# Patient Record
Sex: Female | Born: 1975 | Race: White | Hispanic: No | Marital: Married | Smoking: Never smoker
Health system: Southern US, Community
[De-identification: ages and names within clinical notes are randomized; demographics above are authoritative.]

## PROBLEM LIST (undated history)

## (undated) ENCOUNTER — Inpatient Hospital Stay (HOSPITAL_COMMUNITY): Payer: Self-pay

## (undated) DIAGNOSIS — O039 Complete or unspecified spontaneous abortion without complication: Secondary | ICD-10-CM

## (undated) DIAGNOSIS — J329 Chronic sinusitis, unspecified: Secondary | ICD-10-CM

## (undated) DIAGNOSIS — J309 Allergic rhinitis, unspecified: Secondary | ICD-10-CM

## (undated) DIAGNOSIS — K219 Gastro-esophageal reflux disease without esophagitis: Secondary | ICD-10-CM

## (undated) DIAGNOSIS — F411 Generalized anxiety disorder: Secondary | ICD-10-CM

## (undated) DIAGNOSIS — F419 Anxiety disorder, unspecified: Secondary | ICD-10-CM

## (undated) DIAGNOSIS — Z9889 Other specified postprocedural states: Secondary | ICD-10-CM

## (undated) DIAGNOSIS — K589 Irritable bowel syndrome without diarrhea: Secondary | ICD-10-CM

## (undated) DIAGNOSIS — N390 Urinary tract infection, site not specified: Secondary | ICD-10-CM

## (undated) DIAGNOSIS — R112 Nausea with vomiting, unspecified: Secondary | ICD-10-CM

## (undated) DIAGNOSIS — D649 Anemia, unspecified: Secondary | ICD-10-CM

## (undated) DIAGNOSIS — K5790 Diverticulosis of intestine, part unspecified, without perforation or abscess without bleeding: Secondary | ICD-10-CM

## (undated) HISTORY — DX: Anemia, unspecified: D64.9

## (undated) HISTORY — DX: Chronic sinusitis, unspecified: J32.9

## (undated) HISTORY — DX: Diverticulosis of intestine, part unspecified, without perforation or abscess without bleeding: K57.90

## (undated) HISTORY — PX: CYSTECTOMY: SUR359

## (undated) HISTORY — DX: Gastro-esophageal reflux disease without esophagitis: K21.9

## (undated) HISTORY — DX: Generalized anxiety disorder: F41.1

## (undated) HISTORY — DX: Anxiety disorder, unspecified: F41.9

## (undated) HISTORY — DX: Irritable bowel syndrome, unspecified: K58.9

## (undated) HISTORY — DX: Allergic rhinitis, unspecified: J30.9

## (undated) HISTORY — DX: Urinary tract infection, site not specified: N39.0

---

## 2004-05-25 ENCOUNTER — Other Ambulatory Visit: Admission: RE | Admit: 2004-05-25 | Discharge: 2004-05-25 | Payer: Self-pay | Admitting: Gynecology

## 2005-06-20 ENCOUNTER — Other Ambulatory Visit: Admission: RE | Admit: 2005-06-20 | Discharge: 2005-06-20 | Payer: Self-pay | Admitting: Gynecology

## 2006-06-23 ENCOUNTER — Inpatient Hospital Stay (HOSPITAL_COMMUNITY): Admission: AD | Admit: 2006-06-23 | Discharge: 2006-06-25 | Payer: Self-pay | Admitting: Obstetrics and Gynecology

## 2007-08-20 ENCOUNTER — Other Ambulatory Visit: Admission: RE | Admit: 2007-08-20 | Discharge: 2007-08-20 | Payer: Self-pay | Admitting: Gynecology

## 2008-09-25 ENCOUNTER — Other Ambulatory Visit: Admission: RE | Admit: 2008-09-25 | Discharge: 2008-09-25 | Payer: Self-pay | Admitting: Gynecology

## 2011-01-14 NOTE — Discharge Summary (Signed)
[name], Brooke [name] NO.:  [id number]   MEDICAL RECORD NO.:  [id number]          PATIENT TYPE:  INP   LOCATION:  9111                          FACILITY:  [facility]   PHYSICIAN:  Zenaida Niece, M.D.DATE OF BIRTH:  1976-07-06   DATE OF ADMISSION:  06/23/2006  DATE OF DISCHARGE:  06/25/2006                                 DISCHARGE SUMMARY   ADMISSION DIAGNOSES:  1. Intrauterine pregnancy at 39 weeks.  2. History of infertility.  3. Group B streptococcus carrier.   DISCHARGE DIAGNOSES:  1. Intrauterine pregnancy at 39 weeks.  2. History of infertility.  3. Group B streptococcus carrier.   PROCEDURES:  On June 23, 2006, she had a spontaneous vaginal delivery.   HISTORY AND PHYSICAL:  This is a 35 year old white female gravida 1, para 0  with an EGA of [redacted] weeks who presents with the complaint of spontaneous  rupture of membranes at 0100 on the day of admission.  She also had regular  contractions.  Prenatal care complicated by intrauterine insemination and a  history of infertility.   PRENATAL LABS:  Blood type is O positive with a negative antibody screen,  RPR nonreactive, rubella immune, Hepatitis B Surface Antigen negative, HIV  negative, gonorrhea and Chlamydia negative.  Quad screen is normal, 1-hour  Glucola 99 and group B strep is positive.   PAST MEDICAL HISTORY:  Gastroesophageal reflux.   PAST SURGICAL HISTORY:  Ganglion cyst.   PHYSICAL EXAM:  She is afebrile with stable vital signs.  Fetal heart  tracing is reassuring.  ABDOMEN:  Gravid and nontender.  CERVIX:  On admission she was about 5 cm dilated.   HOSPITAL COURSE:  The patient was admitted and continued to contract on her  own.  She received penicillin for group B strep prophylaxis.  She progressed  and received an epidural.  She progressed to complete, pushed well with some  nausea and vomiting while pushing.  On the afternoon of October 26th she had  a vaginal delivery of a viable  female infant with Apgars of 8 and 9, that  weighed 6 pounds 9 ounces, over a second degree midline episiotomy due to  maternal exhaustion.  Placenta delivered spontaneous was intact and was sent  for cord blood collection.  Her second degree episiotomy was repaired with  #3-0 Vicryl and estimated blood loss was less than 500 mL.  Postpartum she  had no significant complications and on postpartum day #2 was felt to be  stable enough for discharge home.   DISCHARGE INSTRUCTIONS:  Regular diet, pelvic rest, follow up in 6 weeks.   MEDICATIONS:  Over-the-counter ibuprofen as needed and she is given her  discharge pamphlet.      Zenaida Niece, M.D.  Electronically Signed     TDM/MEDQ  D:  06/25/2006  T:  06/26/2006  Job:  [id number]

## 2012-05-31 ENCOUNTER — Institutional Professional Consult (permissible substitution): Payer: Self-pay | Admitting: Internal Medicine

## 2012-06-06 ENCOUNTER — Encounter: Payer: Self-pay | Admitting: Pulmonary Disease

## 2012-06-07 ENCOUNTER — Other Ambulatory Visit (INDEPENDENT_AMBULATORY_CARE_PROVIDER_SITE_OTHER): Payer: BC Managed Care – PPO

## 2012-06-07 ENCOUNTER — Encounter: Payer: Self-pay | Admitting: Pulmonary Disease

## 2012-06-07 ENCOUNTER — Ambulatory Visit (INDEPENDENT_AMBULATORY_CARE_PROVIDER_SITE_OTHER): Payer: BC Managed Care – PPO | Admitting: Pulmonary Disease

## 2012-06-07 VITALS — BP 112/74 | HR 78 | Temp 99.2°F | Ht 63.0 in | Wt 139.8 lb

## 2012-06-07 DIAGNOSIS — R0609 Other forms of dyspnea: Secondary | ICD-10-CM

## 2012-06-07 DIAGNOSIS — R942 Abnormal results of pulmonary function studies: Secondary | ICD-10-CM

## 2012-06-07 DIAGNOSIS — J45909 Unspecified asthma, uncomplicated: Secondary | ICD-10-CM

## 2012-06-07 DIAGNOSIS — I2789 Other specified pulmonary heart diseases: Secondary | ICD-10-CM

## 2012-06-07 DIAGNOSIS — R0600 Dyspnea, unspecified: Secondary | ICD-10-CM

## 2012-06-07 DIAGNOSIS — I272 Pulmonary hypertension, unspecified: Secondary | ICD-10-CM

## 2012-06-07 DIAGNOSIS — R0989 Other specified symptoms and signs involving the circulatory and respiratory systems: Secondary | ICD-10-CM

## 2012-06-07 LAB — TSH: TSH: 1.21 u[IU]/mL (ref 0.35–5.50)

## 2012-06-07 MED ORDER — ALBUTEROL SULFATE HFA 108 (90 BASE) MCG/ACT IN AERS: 2.0000 | INHALATION_SPRAY | Freq: Four times a day (QID) | RESPIRATORY_TRACT | Status: DC | PRN

## 2012-06-07 MED ORDER — SPACER/AERO CHAMBER MOUTHPIECE MISC: 1.0000 | Status: DC

## 2012-06-07 NOTE — Progress Notes (Signed)
Subjective:    Patient ID: Brooke [name], female    DOB: [birth year], 36 y.o.   MRN: [id number]  Brooke [name] is a 36 yo woman, present for a new consult requested by Dr. Sudie Bailey for dyspnea and evaluation for possible pulmonary hypertension.    HPI 36 yo woman with PMH significant for shortness of breath and chest tightness approximately since January 2013. Extensive evaluation has not identified an etiology. Dyspnea occurs at rest but predominantly with exertion, walking on flat surfaces; used to walk several miles daily; now report an episode of significant dyspnea when attempted to walk at a slow pace, 2 1/2 miles.  She denies cough, fever, chills, weight loss, LE edema, paroxysmal nocturnal dyspnea, wheezing, . 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 She admits nasal congestion and drainage, has been given nasonex but she has not used it consistently. Has been given an inhaler several years back but she has not used one in years.   Past Medical History  Diagnosis Date  . Generalized anxiety disorder   . Esophageal reflux   . Unspecified sinusitis (chronic)    Past Surgical History  Procedure Date  . Cystectomy     left wrist   Family History  Problem Relation Age of Onset  . Diabetes Father   . Hypertension Father   . Hypertension Mother   . Diverticulosis Mother   . Prostate cancer Paternal Uncle   . Leukemia Paternal Uncle   . Hypertension Other     sibling   History   Social History  . Marital Status: Married    Spouse Name: N/A    Number of Children: 1  . Years of Education: N/A   Occupational History  . 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 Smoking status: Never Smoker   . Smokeless  tobacco: Never Used  . Alcohol Use: No  . Drug Use: No  . Sexually Active: Not on file   Other Topics Concern  . Not on file   Social History Narrative  . No narrative on file   Allergies  Allergen Reactions  . Prednisone     Racing heart, unable to sleep, "feels crazy"    Review of Systems  Constitutional: Negative for fever, chills, diaphoresis, activity change, appetite change, fatigue and unexpected weight change.  HENT: Positive for congestion and postnasal drip. Negative for hearing loss, ear pain, nosebleeds, sore throat, facial swelling, rhinorrhea, sneezing, mouth sores, trouble swallowing, neck pain, neck stiffness, dental problem, voice change, sinus pressure, tinnitus and ear discharge.   Eyes: Negative for visual disturbance.  Respiratory: Positive for chest tightness and shortness of breath. Negative for cough, choking and wheezing.   Cardiovascular: Positive for chest pain. Negative for palpitations.  Gastrointestinal: Negative for nausea, vomiting, abdominal pain, constipation and abdominal distention.  Genitourinary: Negative for difficulty urinating.  Musculoskeletal: Negative for myalgias, back pain, joint swelling, arthralgias and gait problem.  Skin: Negative for rash.  Neurological: Negative for dizziness, tremors, speech difficulty, weakness, light-headedness and headaches.  Hematological: Does not bruise/bleed easily.  Psychiatric/Behavioral: Negative for confusion, disturbed wake/sleep cycle and agitation. The patient is nervous/anxious.     Current Outpatient Prescriptions  Medication Sig Dispense Refill  .  albuterol (PROVENTIL HFA;VENTOLIN HFA) 108 (90 BASE) MCG/ACT inhaler Inhale 2 puffs into the lungs every 6 (six) hours as needed for wheezing.  1 Inhaler  6  . dexlansoprazole (DEXILANT) 60 MG capsule Take 60 mg by mouth daily.      Marland Kitchen Spacer/Aero Chamber Mouthpiece MISC 1 applicator by Does not apply route 1 day or 1 dose.  1 each  0  . UNABLE TO FIND  Med Name: Progesterone 3% Cream --daily       Results for orders placed in visit on 06/07/12  TSH      Component Value Range   TSH 1.21  0.35 - 5.50 uIU/mL  IGE      Component Value Range   IgE (Immunoglobulin E), Serum 24.8  0.0 - 180.0 IU/mL  EOSINOPHIL COUNT      Component Value Range   Eosinophils Absolute 0.0  0.0 - 0.7 K/uL  ANA      Component Value Range   ANA NEG  NEGATIVE  RHEUMATOID FACTOR      Component Value Range   Rheumatoid Factor 17 (*) <=14 IU/mL         Objective:   Physical Exam  Physical exam.  BP 112/74  Pulse 78  Temp 99.2 F (37.3 C) (Oral)  Ht 5\' 3"  (1.6 m)  Wt 139 lb 12.8 oz (63.413 kg)  BMI 24.76 kg/m2  SpO2 98%  General: Comfortable,  HEENT ; pupils round and reactive to light; severe nasal mucosa edema and erythema; oropharynx  with no erythema, edema or exudate; no cervical LAD  Cardiovascular: s1s2, no murmurs. Regular rate and rhythm. No JVD Respiratory: Decreased breath sounds bilaterally with no wheezing, rhonchi or crackles No increased work of breathing.  Abdomen: soft NT ND BS+.   Extremities: no pedal edema. Homans negative  Central nervous system: Alert and oriented No focal neurological deficits. Skin no rash Musculoskeletal no joint changes, normal bulk and tone  CTA 09/16/11  Lungs clear and no PE (we will have to obtian images)  PFTs 05/24/12 FVC 3.21 89% (pre) 3.21  89% FEV1 2.38 80% (pre) 2.46L 83% FEV1/FVC 74 (pre) 77 FEF 25/75 56% (pre) and 66% (post) 18% improvement   TLC 71% RV 33%  FRC 49%  RV/TLC ration 47  DLCO 121%, DLVA 145%   2 D echo 05/24/2012  EF 60-65%, normal diastolic filling  TR trace regurgitation; unable to estimate RVSP due to inadequate TR jet spectral doppler; IVC dialted; with no significant respiratory collapse, which is consistent with R atrial pressure >20 mm HG         Assessment & Plan:    1. Dyspnea suspect a component of asthma; also PFTs suggestive of a restrictive  disorder, possible respiratory muscle weakness. We will obtain MIP and MEP with next visit. In the interim I advised the patient to initiate inhaled albuterol and use it prior to activity. We will see her back in 2-4 weeks to review the tests. Doubt pulmonary hypertension; the patient has no clinical sign, no jvd, no edema, no murmurs. We will repeat a 2 D echo given that the previous one was associated with technical difficulties and the TR doppler jet was incomplete. If everything turns out negative, I would like her to have a methacholine challenge.   2. Allergic rhinitis use Nasonex daily   3. GERD Continue Dexilant and start Zantac twice daily/   4. General health; she received a flu vaccine  5. F/U 2-4 weeks to review the  tests.   Vanetta Mulders, MD  [facility]  [place], [place]

## 2012-06-07 NOTE — Patient Instructions (Addendum)
We will repeat a TSH and we will obtain some additional blood tests to test you for allergies.   For pulmonary hypertension we will repeat a 2 D echocardiogram to re evaluate pulmonary artery pressure.   Given slight abnormalities on the breathing tests, we will obtain additional tests to assess for respiratory muscle strength.   I think you have a component of asthma; please start taking an inhaler as needed. It is important to have your nasal congestion under control, please use Nasonex daily.   For GERD please start Zantac twice daily and continue Dexilant.   We will see you back in 2-4 weeks to review the tests.

## 2012-06-08 ENCOUNTER — Encounter: Payer: Self-pay | Admitting: Pulmonary Disease

## 2012-06-08 DIAGNOSIS — F411 Generalized anxiety disorder: Secondary | ICD-10-CM | POA: Insufficient documentation

## 2012-06-08 DIAGNOSIS — R942 Abnormal results of pulmonary function studies: Secondary | ICD-10-CM | POA: Insufficient documentation

## 2012-06-08 DIAGNOSIS — J309 Allergic rhinitis, unspecified: Secondary | ICD-10-CM | POA: Insufficient documentation

## 2012-06-08 DIAGNOSIS — R0600 Dyspnea, unspecified: Secondary | ICD-10-CM | POA: Insufficient documentation

## 2012-06-08 DIAGNOSIS — K219 Gastro-esophageal reflux disease without esophagitis: Secondary | ICD-10-CM | POA: Insufficient documentation

## 2012-06-29 ENCOUNTER — Other Ambulatory Visit (HOSPITAL_COMMUNITY): Payer: BC Managed Care – PPO

## 2012-07-03 ENCOUNTER — Ambulatory Visit (HOSPITAL_COMMUNITY): Payer: BC Managed Care – PPO | Attending: Cardiology | Admitting: Radiology

## 2012-07-03 DIAGNOSIS — J45909 Unspecified asthma, uncomplicated: Secondary | ICD-10-CM | POA: Insufficient documentation

## 2012-07-03 DIAGNOSIS — I272 Pulmonary hypertension, unspecified: Secondary | ICD-10-CM

## 2012-07-03 DIAGNOSIS — I369 Nonrheumatic tricuspid valve disorder, unspecified: Secondary | ICD-10-CM | POA: Insufficient documentation

## 2012-07-03 DIAGNOSIS — R0989 Other specified symptoms and signs involving the circulatory and respiratory systems: Secondary | ICD-10-CM | POA: Insufficient documentation

## 2012-07-03 DIAGNOSIS — R0609 Other forms of dyspnea: Secondary | ICD-10-CM | POA: Insufficient documentation

## 2012-07-03 NOTE — Progress Notes (Signed)
Echocardiogram performed.  

## 2012-07-05 ENCOUNTER — Ambulatory Visit: Payer: BC Managed Care – PPO | Admitting: Pulmonary Disease

## 2012-07-11 ENCOUNTER — Ambulatory Visit (INDEPENDENT_AMBULATORY_CARE_PROVIDER_SITE_OTHER): Payer: BC Managed Care – PPO | Admitting: Pulmonary Disease

## 2012-07-11 DIAGNOSIS — I272 Pulmonary hypertension, unspecified: Secondary | ICD-10-CM

## 2012-07-11 DIAGNOSIS — I2789 Other specified pulmonary heart diseases: Secondary | ICD-10-CM

## 2012-07-11 NOTE — Progress Notes (Signed)
PFT done today. 

## 2012-07-20 ENCOUNTER — Ambulatory Visit (INDEPENDENT_AMBULATORY_CARE_PROVIDER_SITE_OTHER): Payer: BC Managed Care – PPO | Admitting: Pulmonary Disease

## 2012-07-20 ENCOUNTER — Encounter: Payer: Self-pay | Admitting: Pulmonary Disease

## 2012-07-20 VITALS — BP 100/60 | HR 71 | Temp 98.1°F | Ht 63.5 in | Wt 139.0 lb

## 2012-07-20 DIAGNOSIS — R0600 Dyspnea, unspecified: Secondary | ICD-10-CM

## 2012-07-20 DIAGNOSIS — K219 Gastro-esophageal reflux disease without esophagitis: Secondary | ICD-10-CM

## 2012-07-20 DIAGNOSIS — R0609 Other forms of dyspnea: Secondary | ICD-10-CM

## 2012-07-20 DIAGNOSIS — R942 Abnormal results of pulmonary function studies: Secondary | ICD-10-CM

## 2012-07-20 DIAGNOSIS — J309 Allergic rhinitis, unspecified: Secondary | ICD-10-CM

## 2012-07-20 MED ORDER — FLUTICASONE PROPIONATE HFA 110 MCG/ACT IN AERO: 1.0000 | INHALATION_SPRAY | Freq: Two times a day (BID) | RESPIRATORY_TRACT | Status: DC

## 2012-07-20 NOTE — Patient Instructions (Addendum)
Please initiate Flovent, one spray daily.   We will schedule a breathing test, called Methacholine challenge test and a liver ultrasound.   We will see you back in 2 months.

## 2012-07-22 NOTE — Progress Notes (Signed)
Subjective:    Patient ID: Brooke [name], female    DOB: [birth year], 36 y.o.   MRN: [id number]  Brooke [name] is a 36 yo woman, presents for regular follow up  for dyspnea and evaluation for possible pulmonary hypertension.    HPI 36 yo woman with PMH significant for shortness of breath and chest tightness approximately since January 2013. Extensive evaluation has not identified an etiology. Dyspnea occurs at rest but predominantly with exertion, walking on flat surfaces; used to walk several miles daily; now report an episode of significant dyspnea when attempted to walk at a slow pace, 2 1/2 miles.  She denies cough, fever, chills, weight loss, LE edema, paroxysmal nocturnal dyspnea, wheezing, . 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 She admits nasal congestion and drainage, has been given nasonex but she has not used it consistently. Has been given an inhaler several years back but she has not used one in years.   Last visit. Albuterol was started for prn use; she has not been using it consistently prior to exertion; she thinks it helps with the symptoms.   Today: reports no acute problems or changes in her health status. Denies fever, chills, cough; dyspnea as per HPI; no hemoptysis; chest tightness as above. No LE edema or paroxysmal nocturnal dyspnea. Cont to have GERD symptoms, but improved.    Past Medical History  Diagnosis Date  . Generalized anxiety disorder   . Esophageal reflux   . Unspecified sinusitis (chronic)   . Allergic rhinitis    Past Surgical History  Procedure Date  . Cystectomy     left wrist   Family History  Problem Relation Age of Onset  . Diabetes Father   . Hypertension Father   . Hypertension Mother   . Diverticulosis  Mother   . Prostate cancer Paternal Uncle   . Leukemia Paternal Uncle   . Hypertension Other     sibling   History   Social History  . Marital Status: Married    Spouse Name: N/A    Number of Children: 1  . Years of Education: N/A   Occupational History  . 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 Smoking status: Never Smoker   . Smokeless tobacco: Never Used  . Alcohol Use: No  . Drug Use: No  . Sexually Active: Not on file   Other Topics Concern  . Not on file   Social History Narrative  . No narrative on file   Allergies  Allergen Reactions  . Prednisone     Racing heart, unable to sleep, "feels crazy"    Review of Systems  Constitutional: Negative for fever, chills, diaphoresis, activity change, appetite change, fatigue and unexpected weight change.  HENT: Positive for congestion and postnasal drip. Negative for hearing loss, ear pain, nosebleeds, sore throat, facial swelling, rhinorrhea, sneezing, mouth sores, trouble swallowing, neck pain, neck stiffness, dental problem, voice change, sinus pressure, tinnitus and ear discharge.   Eyes: Negative for visual disturbance.  Respiratory: Positive for chest tightness and shortness of breath. Negative for cough, choking and wheezing.   Cardiovascular: Negative for chest pain. Negative for palpitations.  Gastrointestinal: Negative for nausea, vomiting, abdominal pain, constipation and abdominal  distention.  Genitourinary: Negative for difficulty urinating.  Musculoskeletal: Negative for myalgias, back pain, joint swelling, arthralgias and gait problem.  Skin: Negative for rash.  Neurological: Negative for dizziness, tremors, speech difficulty, weakness, light-headedness and headaches.  Hematological: Does not bruise/bleed easily.  Psychiatric/Behavioral: Negative for confusion, disturbed wake/sleep cycle and agitation.     Current Outpatient Prescriptions  Medication Sig Dispense  Refill  . albuterol (PROVENTIL HFA;VENTOLIN HFA) 108 (90 BASE) MCG/ACT inhaler Inhale 2 puffs into the lungs every 6 (six) hours as needed for wheezing.  1 Inhaler  6  . dexlansoprazole (DEXILANT) 60 MG capsule Take 60 mg by mouth daily.      Marland Kitchen NASONEX 50 MCG/ACT nasal spray 2 sprays each nostril once a day      . Spacer/Aero Chamber Mouthpiece MISC 1 applicator by Does not apply route 1 day or 1 dose.  1 each  0  . UNABLE TO FIND Med Name: Progesterone 3% Cream --daily      . fluticasone (FLOVENT HFA) 110 MCG/ACT inhaler Inhale 1 puff into the lungs 2 (two) times daily.  1 Inhaler  12   Results for orders placed in visit on 06/07/12  TSH      Component Value Range   TSH 1.21  0.35 - 5.50 uIU/mL  IGE      Component Value Range   IgE (Immunoglobulin E), Serum 24.8  0.0 - 180.0 IU/mL  EOSINOPHIL COUNT      Component Value Range   Eosinophils Absolute 0.0  0.0 - 0.7 K/uL  ANA      Component Value Range   ANA NEG  NEGATIVE  RHEUMATOID FACTOR      Component Value Range   Rheumatoid Factor 17 (*) <=14 IU/mL         Objective:   Physical Exam  Physical exam.  BP 100/60  Pulse 71  Temp 98.1 F (36.7 C) (Oral)  Ht 5' 3.5" (1.613 m)  Wt 63.05 kg (139 lb)  BMI 24.24 kg/m2  SpO2 99%   General: Comfortable,  HEENT ; pupils round and reactive to light; severe nasal mucosa edema and erythema; oropharynx  with no erythema, edema or exudate; no cervical LAD  Cardiovascular: s1s2, no murmurs. Regular rate and rhythm. No JVD but hepato jugular reflux;  Respiratory: Decreased breath sounds bilaterally with no wheezing, rhonchi or crackles No increased work of breathing.  Abdomen: soft NT ND BS+.  No hepatomegaly or plenomegaly Extremities: no pedal edema. Homans negative  Central nervous system: Alert and oriented No focal neurological deficits. Skin no rash Musculoskeletal no joint changes, normal bulk and tone  CTA 09/16/11  Lungs clear and no PE (we will have to obtian  images)  PFTs 05/24/12 FVC 3.21 89% (pre) 3.21  89% FEV1 2.38 80% (pre) 2.46L 83% FEV1/FVC 74 (pre) 77 FEF 25/75 56% (pre) and 66% (post) 18% improvement   TLC 71% RV 33%  FRC 49%  RV/TLC ration 47  DLCO 121%, DLVA 145%   2 D echo 05/24/2012  EF 60-65%, normal diastolic filling  TR trace regurgitation; unable to estimate RVSP due to inadequate TR jet spectral doppler; IVC dialted; with no significant respiratory collapse, which is consistent with R atrial pressure >20 mm HG   2 D echo 07/03/2012  Left ventricle: The cavity size was normal. Wall thickness was normal. The estimated ejection fraction was 60%. Wall motion was normal; there were no regional wall motion abnormalities. Left ventricular diastolic function parameters were normal. - Impressions: IVC  is dilated with normal collapse. This suggests CVP of 6-10. Impressions: - IVC is dilated with normal collapse. This suggests CVP of 6-10.        Assessment & Plan:    1. Dyspnea suspect a component of asthma; Her symptoms are suggestive of asthma; also PFTs suggestive of a restrictive disorder, possible respiratory muscle weakness. We will obtain MIP and MEP with next visit. In the interim I advised the patient to continue inhaled albuterol and use it prior to activity. We will see her back in January to review the tests.  I would like her to have a methacholine challenge. In the interim I advised her to initiate inhaled steroids.   Repeat a 2 D echo with no evidence of TR or pulmonary hypertension, but persistent IVC dilation.   On exam she does have hepatojugular reflux.For IVC dialation I will obtain a US of the RUQ with doppler to evaluate for suprahepatic/hepatic vein thrombosis; If this test is negative I will refer her to cardiology for cardiac MRI and possible R heart catheterization.   2. Allergic rhinitis use Nasonex daily   3. GERD Continue Dexilant and start Zantac twice daily/   4. General health; she  received a flu vaccine  5. F/U 2  months to review the tests.   Vanetta Mulders, MD  [facility]  [place], [place]

## 2012-07-23 NOTE — Addendum Note (Signed)
Addended by: Tommie Sams on: 07/23/2012 08:46 AM   Modules accepted: Orders

## 2012-07-31 ENCOUNTER — Other Ambulatory Visit: Payer: Self-pay | Admitting: Pulmonary Disease

## 2012-07-31 ENCOUNTER — Ambulatory Visit (HOSPITAL_COMMUNITY)
Admission: RE | Admit: 2012-07-31 | Discharge: 2012-07-31 | Disposition: A | Discharge status: Home / Self Care | Payer: BC Managed Care – PPO | Source: Ambulatory Visit | Attending: Pulmonary Disease | Admitting: Pulmonary Disease

## 2012-07-31 DIAGNOSIS — R0600 Dyspnea, unspecified: Secondary | ICD-10-CM

## 2012-07-31 DIAGNOSIS — R0609 Other forms of dyspnea: Secondary | ICD-10-CM | POA: Insufficient documentation

## 2012-07-31 DIAGNOSIS — R0989 Other specified symptoms and signs involving the circulatory and respiratory systems: Secondary | ICD-10-CM | POA: Insufficient documentation

## 2012-08-01 ENCOUNTER — Telehealth: Payer: Self-pay | Admitting: Pulmonary Disease

## 2012-08-01 ENCOUNTER — Encounter (HOSPITAL_COMMUNITY): Payer: BC Managed Care – PPO

## 2012-08-01 DIAGNOSIS — I871 Compression of vein: Secondary | ICD-10-CM

## 2012-08-01 NOTE — Telephone Encounter (Signed)
I talked to the patient and reviewed the test results.  Mindy,  Could you please order an MRI of the heart for Ms. Vuncannon;  Thank you,

## 2012-08-01 NOTE — Telephone Encounter (Signed)
I spoke with pt and she stated she is returning Dr. Liliane Channel call regarding her test results from 07/31/12. Pt aware she is not back in the office until 08/02/12. Please advise Dr. Frederico Hamman thanks

## 2012-08-02 NOTE — Telephone Encounter (Signed)
I have spoken with Dr. Frederico Hamman and have placed ordered as directed with her. Orders sent to [facility]. thanks

## 2012-08-02 NOTE — Telephone Encounter (Signed)
mindy will you close this out i an't close these messages amymore Tobe Sos

## 2012-08-16 ENCOUNTER — Encounter (HOSPITAL_COMMUNITY): Payer: BC Managed Care – PPO

## 2012-08-23 ENCOUNTER — Inpatient Hospital Stay (HOSPITAL_COMMUNITY): Admission: RE | Admit: 2012-08-23 | Payer: BC Managed Care – PPO | Source: Ambulatory Visit

## 2012-08-27 ENCOUNTER — Encounter (HOSPITAL_COMMUNITY): Payer: BC Managed Care – PPO

## 2012-09-06 ENCOUNTER — Telehealth: Payer: Self-pay | Admitting: Pulmonary Disease

## 2012-09-06 ENCOUNTER — Encounter (HOSPITAL_COMMUNITY): Payer: BC Managed Care – PPO

## 2012-09-06 NOTE — Telephone Encounter (Signed)
Called and spoke with patient and she is aware that we are working on getting this scheduled and will get back with her just as soon as appointment has been scheduled. Brooke [name]

## 2012-09-06 NOTE — Telephone Encounter (Signed)
This is the order that Cardiology stated that was put in incorrectly and Almyra Free advised what cardiology had stated that order needed to be re-entered correctly. The order was placed correctly and order was cancelled by someone. We have to go through Cardiology to schedule b/c one of their physicians are there to read. Waiting on Cardiology to return my call to schedule. Rhonda J Cobb

## 2012-09-06 NOTE — Telephone Encounter (Signed)
Order was placed 08/02/12. Does not look this  Was scheduled for pt. Please advise [facility] thanks

## 2012-09-10 NOTE — Telephone Encounter (Signed)
[facility], pls advise if this has been scheduled yet.  Thank you.

## 2012-09-10 NOTE — Telephone Encounter (Signed)
Apparently there is only one scheduler at [facility] that schedules this test. I have called trying to get this scheduled, however, scheduler is out sick today and Cardiology does not know if she will be back tomorrow. Pt is going out of town on 09/12/12 and will not return until 09/18/12. Pt has appointment with Dr. Frederico Hamman on 09/19/12. Does this appointment need to be r/s until I can get the MRI Cardiac scheduled? Please advise. Rhonda J Cobb

## 2012-09-10 NOTE — Telephone Encounter (Signed)
Have sent 2 staff messages to Cardiology to arrange. Tests have been approved from insurance and pending scheduling. Sent urgent staff message to scheduler to arrange asap. Pt has an follow up ov to see Dr. Frederico Hamman on 09/19/12. Rhonda J Cobb

## 2012-09-11 ENCOUNTER — Other Ambulatory Visit: Payer: Self-pay | Admitting: Pulmonary Disease

## 2012-09-11 DIAGNOSIS — R0602 Shortness of breath: Secondary | ICD-10-CM

## 2012-09-11 NOTE — Telephone Encounter (Signed)
LMTCB x 1 for the pt. It looks like she is scheduled for MRI on 09/17/12 and just want to make sure this is correct andif so we can keep the appt with Dr. Frederico Hamman as scheduled.

## 2012-09-11 NOTE — Telephone Encounter (Signed)
Yes please, ask to reschedule after test done.

## 2012-09-11 NOTE — Telephone Encounter (Signed)
Pt returned triage's call.  Pt states that she will not be completing MRI on 09/17/12 as she will be out of town.  Antionette Fairy

## 2012-09-11 NOTE — Telephone Encounter (Signed)
i spoke with pt. She stated she is going to call us back when she r/s her MRI. She stated cardiology will be giving her a call once they r/s this with her. Nothing further was needed

## 2012-09-11 NOTE — Telephone Encounter (Signed)
Per Bjorn Loser, she has not been able to get this test scheduled and the pt has an appt with Dr. Frederico Hamman on 09/19/12. Dr. Frederico Hamman, do you want to reschedule your appt with the pt and see her after the test has been done? Pls advise.

## 2012-09-12 ENCOUNTER — Encounter: Payer: Self-pay | Admitting: Pulmonary Disease

## 2012-09-17 ENCOUNTER — Ambulatory Visit (HOSPITAL_COMMUNITY): Payer: BC Managed Care – PPO

## 2012-09-18 ENCOUNTER — Telehealth: Payer: Self-pay | Admitting: Pulmonary Disease

## 2012-09-18 ENCOUNTER — Ambulatory Visit (HOSPITAL_COMMUNITY)
Admission: RE | Admit: 2012-09-18 | Discharge: 2012-09-18 | Disposition: A | Discharge status: Home / Self Care | Payer: BC Managed Care – PPO | Source: Ambulatory Visit | Attending: Pulmonary Disease | Admitting: Pulmonary Disease

## 2012-09-18 DIAGNOSIS — R0989 Other specified symptoms and signs involving the circulatory and respiratory systems: Secondary | ICD-10-CM | POA: Insufficient documentation

## 2012-09-18 DIAGNOSIS — R0600 Dyspnea, unspecified: Secondary | ICD-10-CM

## 2012-09-18 DIAGNOSIS — R0609 Other forms of dyspnea: Secondary | ICD-10-CM | POA: Insufficient documentation

## 2012-09-18 DIAGNOSIS — R942 Abnormal results of pulmonary function studies: Secondary | ICD-10-CM | POA: Insufficient documentation

## 2012-09-18 LAB — PULMONARY FUNCTION TEST

## 2012-09-18 MED ORDER — METHACHOLINE 16 MG/ML NEB SOLN
2.0000 mL | Freq: Once | RESPIRATORY_TRACT | Status: AC
  Administered 2012-09-18: 32 mg via RESPIRATORY_TRACT

## 2012-09-18 MED ORDER — METHACHOLINE 0.25 MG/ML NEB SOLN
2.0000 mL | Freq: Once | RESPIRATORY_TRACT | Status: AC
  Administered 2012-09-18: 0.5 mg via RESPIRATORY_TRACT

## 2012-09-18 MED ORDER — SODIUM CHLORIDE 0.9 % IN NEBU
3.0000 mL | INHALATION_SOLUTION | Freq: Once | RESPIRATORY_TRACT | Status: AC
  Administered 2012-09-18: 3 mL via RESPIRATORY_TRACT

## 2012-09-18 MED ORDER — METHACHOLINE 4 MG/ML NEB SOLN
2.0000 mL | Freq: Once | RESPIRATORY_TRACT | Status: AC
  Administered 2012-09-18: 8 mg via RESPIRATORY_TRACT

## 2012-09-18 MED ORDER — METHACHOLINE 0.0625 MG/ML NEB SOLN
2.0000 mL | Freq: Once | RESPIRATORY_TRACT | Status: AC
  Administered 2012-09-18: 0.125 mg via RESPIRATORY_TRACT

## 2012-09-18 MED ORDER — ALBUTEROL SULFATE (5 MG/ML) 0.5% IN NEBU
2.5000 mg | INHALATION_SOLUTION | Freq: Once | RESPIRATORY_TRACT | Status: AC
  Administered 2012-09-18: 2.5 mg via RESPIRATORY_TRACT

## 2012-09-18 MED ORDER — METHACHOLINE 1 MG/ML NEB SOLN
2.0000 mL | Freq: Once | RESPIRATORY_TRACT | Status: AC
  Administered 2012-09-18: 2 mg via RESPIRATORY_TRACT

## 2012-09-18 NOTE — Telephone Encounter (Signed)
Pt just needs to schedule OV after she completes her tests w/ Dr. Frederico Hamman.  lmomtcb x1

## 2012-09-19 ENCOUNTER — Ambulatory Visit: Payer: BC Managed Care – PPO | Admitting: Pulmonary Disease

## 2012-09-19 NOTE — Telephone Encounter (Signed)
LMTCB x 1 

## 2012-09-19 NOTE — Telephone Encounter (Signed)
Returning call can be reached at [phone].Raylene Everts

## 2012-09-19 NOTE — Telephone Encounter (Signed)
Pt just needs to schedule OV with Dr. Frederico Hamman lmtcb x1

## 2012-09-20 NOTE — Telephone Encounter (Signed)
LMTCB

## 2012-09-21 ENCOUNTER — Telehealth: Payer: Self-pay | Admitting: Pulmonary Disease

## 2012-09-21 DIAGNOSIS — R0600 Dyspnea, unspecified: Secondary | ICD-10-CM

## 2012-09-21 NOTE — Telephone Encounter (Signed)
There is another message open on this pt. I will close this one and continue in new message. Carron Curie, CMA

## 2012-09-21 NOTE — Telephone Encounter (Signed)
Spoke with Rodney Booze at [facility] patient scheduled for MRI per Dr. Frederico Hamman on Monday Jan 27. Tasha indicates Creatinine only is needed for this test. Order placed.   According to previous phone msg from 09/06/12, pt still needs OV scheduled w Dr. Frederico Hamman.  ATC pt no answer. LMOMTCB

## 2012-09-21 NOTE — Telephone Encounter (Signed)
LMTCBx1 pt needs to schedule an appt with Dr. Frederico Hamman. Carron Curie, CMA

## 2012-09-24 ENCOUNTER — Ambulatory Visit (HOSPITAL_COMMUNITY): Admission: RE | Admit: 2012-09-24 | Payer: BC Managed Care – PPO | Source: Ambulatory Visit

## 2012-09-24 ENCOUNTER — Ambulatory Visit (HOSPITAL_COMMUNITY)
Admission: RE | Admit: 2012-09-24 | Discharge: 2012-09-24 | Disposition: A | Discharge status: Home / Self Care | Payer: BC Managed Care – PPO | Source: Ambulatory Visit | Attending: Pulmonary Disease | Admitting: Pulmonary Disease

## 2012-09-24 DIAGNOSIS — R0989 Other specified symptoms and signs involving the circulatory and respiratory systems: Secondary | ICD-10-CM | POA: Insufficient documentation

## 2012-09-24 DIAGNOSIS — R0602 Shortness of breath: Secondary | ICD-10-CM

## 2012-09-24 DIAGNOSIS — I871 Compression of vein: Secondary | ICD-10-CM

## 2012-09-24 DIAGNOSIS — R0609 Other forms of dyspnea: Secondary | ICD-10-CM | POA: Insufficient documentation

## 2012-09-24 DIAGNOSIS — K7689 Other specified diseases of liver: Secondary | ICD-10-CM | POA: Insufficient documentation

## 2012-09-24 MED ORDER — GADOBENATE DIMEGLUMINE 529 MG/ML IV SOLN
20.0000 mL | Freq: Once | INTRAVENOUS | Status: AC
  Administered 2012-09-24: 20 mL via INTRAVENOUS

## 2012-09-25 NOTE — Telephone Encounter (Signed)
Pt is scheduled to see Dr. Frederico Hamman on 09/26/12 for follow-up on MRI.

## 2012-09-26 ENCOUNTER — Telehealth: Payer: Self-pay | Admitting: Pulmonary Disease

## 2012-09-26 ENCOUNTER — Ambulatory Visit: Payer: BC Managed Care – PPO | Admitting: Pulmonary Disease

## 2012-09-26 ENCOUNTER — Ambulatory Visit (INDEPENDENT_AMBULATORY_CARE_PROVIDER_SITE_OTHER): Payer: BC Managed Care – PPO | Admitting: Pulmonary Disease

## 2012-09-26 ENCOUNTER — Encounter: Payer: Self-pay | Admitting: Pulmonary Disease

## 2012-09-26 VITALS — BP 102/70 | HR 72 | Temp 97.7°F | Ht 63.5 in | Wt 142.4 lb

## 2012-09-26 DIAGNOSIS — R942 Abnormal results of pulmonary function studies: Secondary | ICD-10-CM

## 2012-09-26 DIAGNOSIS — K219 Gastro-esophageal reflux disease without esophagitis: Secondary | ICD-10-CM

## 2012-09-26 DIAGNOSIS — J309 Allergic rhinitis, unspecified: Secondary | ICD-10-CM

## 2012-09-26 DIAGNOSIS — R0600 Dyspnea, unspecified: Secondary | ICD-10-CM

## 2012-09-26 DIAGNOSIS — R0989 Other specified symptoms and signs involving the circulatory and respiratory systems: Secondary | ICD-10-CM

## 2012-09-26 MED ORDER — ALBUTEROL SULFATE HFA 108 (90 BASE) MCG/ACT IN AERS: 2.0000 | INHALATION_SPRAY | Freq: Four times a day (QID) | RESPIRATORY_TRACT | Status: DC | PRN

## 2012-09-26 NOTE — Progress Notes (Signed)
Subjective:    Patient ID: Brooke [name], female    DOB: [birth year], 37 y.o.   MRN: [id number]  Brooke [name] is a 37 yo woman, presents for regular follow up  for dyspnea and evaluation for possible pulmonary hypertension.    HPI 37 yo woman with PMH significant for shortness of breath and chest tightness approximately since January 2013. Extensive evaluation has not identified an etiology. Dyspnea occurs at rest but predominantly with exertion, walking on flat surfaces; used to walk several miles daily; now report an episode of significant dyspnea when attempted to walk at a slow pace, 2 1/2 miles.     She denies cough, fever, chills, weight loss, LE edema, paroxysmal nocturnal dyspnea, wheezing, . 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 She admits nasal congestion and drainage, has been given nasonex but she has not used it consistently. Has been given an inhaler several years back but she has not used one in years.   Today: reports no acute problems or changes in her health status. Denies fever, chills, cough; dyspnea as per HPI; no hemoptysis; chest tightness as above. No LE edema or paroxysmal nocturnal dyspnea.    Past Medical History  Diagnosis Date  . Generalized anxiety disorder   . Esophageal reflux   . Unspecified sinusitis (chronic)   . Allergic rhinitis    Past Surgical History  Procedure Date  . Cystectomy     left wrist   Family History  Problem Relation Age of Onset  . Diabetes Father   . Hypertension Father   . Hypertension Mother   . Diverticulosis Mother   . Prostate cancer Paternal Uncle   . Leukemia Paternal Uncle   . Hypertension Other     sibling   History   Social History  . Marital Status: Married    Spouse Name: N/A     Number of Children: 1  . Years of Education: N/A   Occupational History  . 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 Smoking status: Never Smoker   . Smokeless tobacco: Never Used  . Alcohol Use: No  . Drug Use: No  . Sexually Active: Not on file   Other Topics Concern  . Not on file   Social History Narrative  . No narrative on file   Allergies  Allergen Reactions  . Prednisone     Racing heart, unable to sleep, "feels crazy"    Review of Systems  Constitutional: Negative for fever, chills, diaphoresis, activity change, appetite change, fatigue and unexpected weight change.  HENT: Positive for congestion and postnasal drip. Negative for hearing loss, ear pain, nosebleeds, sore throat, facial swelling, rhinorrhea, sneezing, mouth sores, trouble swallowing, neck pain, neck stiffness, dental problem, voice change, sinus pressure, tinnitus and ear discharge.   Eyes: Negative for visual disturbance.  Respiratory: Positive for chest tightness and shortness of breath. Negative for cough, choking and wheezing.   Cardiovascular: Negative for chest pain. Negative for palpitations.  Gastrointestinal: Negative for nausea, vomiting, abdominal pain, constipation and abdominal distention.  Genitourinary: Negative for difficulty urinating.  Musculoskeletal: Negative for myalgias, back pain, joint swelling, arthralgias and gait problem.  Skin: Negative for rash.  Neurological: Negative for dizziness, tremors,  speech difficulty, weakness, light-headedness and headaches.  Hematological: Does not bruise/bleed easily.  Psychiatric/Behavioral: Negative for confusion, disturbed wake/sleep cycle and agitation.     Current Outpatient Prescriptions  Medication Sig Dispense Refill  . albuterol (PROAIR HFA) 108 (90 BASE) MCG/ACT inhaler Inhale 2 puffs into the lungs every 6 (six) hours as needed for wheezing.  1 Inhaler  11  . dexlansoprazole (DEXILANT) 60 MG  capsule Take 60 mg by mouth daily.      . fluticasone (FLOVENT HFA) 110 MCG/ACT inhaler Inhale 1 puff into the lungs 2 (two) times daily.  1 Inhaler  12  . NASONEX 50 MCG/ACT nasal spray 2 sprays each nostril once a day      . Spacer/Aero Chamber Mouthpiece MISC 1 applicator by Does not apply route 1 day or 1 dose.  1 each  0  . UNABLE TO FIND Med Name: Progesterone 3% Cream --daily       Results for orders placed during the hospital encounter of 09/24/12  CREATININE, SERUM      Component Value Range   Creatinine, Ser 0.77  0.50 - 1.10 mg/dL   GFR calc non Af Amer >90  >90 mL/min   GFR calc Af Amer >90  >90 mL/min         Objective:   Physical Exam  Physical exam.  BP 102/70  Pulse 72  Temp 97.7 F (36.5 C) (Oral)  Ht 5' 3.5" (1.613 m)  Wt 142 lb 6.4 oz (64.592 kg)  BMI 24.83 kg/m2  SpO2 100%   General: Comfortable,  HEENT ; pupils round and reactive to light; severe nasal mucosa edema and erythema; oropharynx  with no erythema, edema or exudate; no cervical LAD  Cardiovascular: s1s2, no murmurs. Regular rate and rhythm. No JVD but hepato jugular reflux;  Respiratory: Decreased breath sounds bilaterally with no wheezing, rhonchi or crackles No increased work of breathing.  Abdomen: soft NT ND BS+.  No hepatomegaly or plenomegaly Extremities: no pedal edema. Homans negative  Central nervous system: Alert and oriented No focal neurological deficits. Skin no rash Musculoskeletal no joint changes, normal bulk and tone  CTA 09/16/11  Lungs clear and no PE (we will have to obtian images)  PFTs 05/24/12 FVC 3.21 89% (pre) 3.21  89% FEV1 2.38 80% (pre) 2.46L 83% FEV1/FVC 74 (pre) 77 FEF 25/75 56% (pre) and 66% (post) 18% improvement   TLC 71% RV 33%  FRC 49%  RV/TLC ration 47  DLCO 121%, DLVA 145%   2 D echo 05/24/2012  EF 60-65%, normal diastolic filling  TR trace regurgitation; unable to estimate RVSP due to inadequate TR jet spectral doppler; IVC dialted; with no  significant respiratory collapse, which is consistent with R atrial pressure >20 mm HG   2 D echo 07/03/2012  Left ventricle: The cavity size was normal. Wall thickness was normal. The estimated ejection fraction was 60%. Wall motion was normal; there were no regional wall motion abnormalities. Left ventricular diastolic function parameters were normal. - Impressions: IVC is dilated with normal collapse. This suggests CVP of 6-10. Impressions: - IVC is dilated with normal collapse. This suggests CVP of 6-10.  MRI of the heart Impression:  1) Normal appearing IVC measures 1.6 cm with minor narrowing  above diaphragm. Not interrupted and normal azygous  2) Normal RV/LV EF 68%  3) No hyperenhancement of myocardium  4) No shunt by TRKS imaging  5) Normal RV and TV with no signs of cor pumonale  6) Central liver  lesion see [place] report by Dr Nance Pew MD [facility]       Assessment & Plan:    1. Dyspnea remains of unclear etiology; methacholine challenge was negative. FEF 25-75 with a 55% decrease with methacholine; given history of detected skin allergy to mold I advised her to continue Flovent when she knows she will be exposed to mold/dust. Could have exercise induced asthma, so I advised Brooke [name] to continue albuterol prior to exercising. I suspect deconditioning also contributing to patients symptoms so I recommend incremental increase in exercise.  If despite these interventions, dyspnea persists, would recommend cardiopulmonary exercise testing.   Repeat a 2 D echo with no evidence of TR or pulmonary hypertension, but persistent IVC dilation.  MRI/MRA of the heart and large vessel normal. I do not see any evidence of pulmonary hypertension.   MIP/MEP performed but not available for my review. Were decreased in the past; it is possible but unlikely that Brooke [name] has a neuro muscular condition contributing to her symptoms. Could consider EMG in the future if this  Issue remains a concern.    2. Allergic rhinitis use Nasonex daily   3. GERD Continue Dexilant and start Zantac twice daily/   4. General health; she received a flu vaccine  5. F/U advised to schedule an appointment with our office as needed.    Vanetta Mulders, MD  [facility]  [place], [place]

## 2012-09-26 NOTE — Telephone Encounter (Signed)
Mindy,   Could you please do me a favor and find out what the value and percent of MIP and MEP was for Ms. Vuncannon. I thought it was not done when I saw her in clinic today, but there is an encounter documented in the system...   thank you

## 2012-09-26 NOTE — Patient Instructions (Addendum)
Please continue current medications. Take Flovent as needed when working in an environment that causes increased dyspnea. Please use Albuterol before exercising.

## 2012-09-27 NOTE — Telephone Encounter (Signed)
ATC michelle/ NA wcb to get this

## 2012-09-27 NOTE — Progress Notes (Signed)
Subjective:    Patient ID: Brooke [name], female    DOB: 10/09/1975, 36 y.o.   MRN: [id number]  Brooke [name] is a 36 yo woman, presents for regular follow up  for dyspnea and evaluation for possible pulmonary hypertension.    HPI 36 yo woman with PMH significant for shortness of breath and chest tightness approximately since January 2013. Extensive evaluation has not identified an etiology. Dyspnea occurs at rest but predominantly with exertion, walking on flat surfaces; used to walk several miles daily; now report an episode of significant dyspnea when attempted to walk at a slow pace, 2 1/2 miles.     She denies cough, fever, chills, weight loss, LE edema, paroxysmal nocturnal dyspnea, wheezing, . 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 She admits nasal congestion and drainage, has been given nasonex but she has not used it consistently. Has been given an inhaler several years back but she has not used one in years.   Today: reports no acute problems or changes in her health status. Denies fever, chills, cough; dyspnea as per HPI; no hemoptysis; chest tightness as above. No LE edema or paroxysmal nocturnal dyspnea.    Past Medical History  Diagnosis Date  . Generalized anxiety disorder   . Esophageal reflux   . Unspecified sinusitis (chronic)   . Allergic rhinitis    Past Surgical History  Procedure Date  . Cystectomy     left wrist   Family History  Problem Relation Age of Onset  . Diabetes Father   . Hypertension Father   . Hypertension Mother   . Diverticulosis Mother   . Prostate cancer Paternal Uncle   . Leukemia Paternal Uncle   . Hypertension Other     sibling   History   Social History  . Marital Status: Married    Spouse Name: N/A     Number of Children: 1  . Years of Education: N/A   Occupational History  . 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 Smoking status: Never Smoker   . Smokeless tobacco: Never Used  . Alcohol Use: No  . Drug Use: No  . Sexually Active: Not on file   Other Topics Concern  . Not on file   Social History Narrative  . No narrative on file   Allergies  Allergen Reactions  . Prednisone     Racing heart, unable to sleep, "feels crazy"    Review of Systems  Constitutional: Negative for fever, chills, diaphoresis, activity change, appetite change, fatigue and unexpected weight change.  HENT: Positive for congestion and postnasal drip. Negative for hearing loss, ear pain, nosebleeds, sore throat, facial swelling, rhinorrhea, sneezing, mouth sores, trouble swallowing, neck pain, neck stiffness, dental problem, voice change, sinus pressure, tinnitus and ear discharge.   Eyes: Negative for visual disturbance.  Respiratory: Positive for chest tightness and shortness of breath. Negative for cough, choking and wheezing.   Cardiovascular: Negative for chest pain. Negative for palpitations.  Gastrointestinal: Negative for nausea, vomiting, abdominal pain, constipation and abdominal distention.  Genitourinary: Negative for difficulty urinating.  Musculoskeletal: Negative for myalgias, back pain, joint swelling, arthralgias and gait problem.  Skin: Negative for rash.  Neurological: Negative for dizziness, tremors,   speech difficulty, weakness, light-headedness and headaches.  Hematological: Does not bruise/bleed easily.  Psychiatric/Behavioral: Negative for confusion, disturbed wake/sleep cycle and agitation.     Current Outpatient Prescriptions  Medication Sig Dispense Refill  . albuterol (PROAIR HFA) 108 (90 BASE) MCG/ACT inhaler Inhale 2 puffs into the lungs every 6 (six) hours as needed for wheezing.  1 Inhaler  11  . dexlansoprazole (DEXILANT) 60 MG  capsule Take 60 mg by mouth daily.      . fluticasone (FLOVENT HFA) 110 MCG/ACT inhaler Inhale 1 puff into the lungs 2 (two) times daily.  1 Inhaler  12  . NASONEX 50 MCG/ACT nasal spray 2 sprays each nostril once a day      . Spacer/Aero Chamber Mouthpiece MISC 1 applicator by Does not apply route 1 day or 1 dose.  1 each  0  . UNABLE TO FIND Med Name: Progesterone 3% Cream --daily       Results for orders placed during the hospital encounter of 09/24/12  CREATININE, SERUM      Component Value Range   Creatinine, Ser 0.77  0.50 - 1.10 mg/dL   GFR calc non Af Amer >90  >90 mL/min   GFR calc Af Amer >90  >90 mL/min         Objective:   Physical Exam  Physical exam.  BP 102/70  Pulse 72  Temp 97.7 F (36.5 C) (Oral)  Ht 5' 3.5" (1.613 m)  Wt 142 lb 6.4 oz (64.592 kg)  BMI 24.83 kg/m2  SpO2 100%   General: Comfortable,  HEENT ; pupils round and reactive to light; severe nasal mucosa edema and erythema; oropharynx  with no erythema, edema or exudate; no cervical LAD  Cardiovascular: s1s2, no murmurs. Regular rate and rhythm. No JVD but hepato jugular reflux;  Respiratory: Decreased breath sounds bilaterally with no wheezing, rhonchi or crackles No increased work of breathing.  Abdomen: soft NT ND BS+.  No hepatomegaly or plenomegaly Extremities: no pedal edema. Homans negative  Central nervous system: Alert and oriented No focal neurological deficits. Skin no rash Musculoskeletal no joint changes, normal bulk and tone  CTA 09/16/11  Lungs clear and no PE (we will have to obtian images)  PFTs 05/24/12 FVC 3.21 89% (pre) 3.21  89% FEV1 2.38 80% (pre) 2.46L 83% FEV1/FVC 74 (pre) 77 FEF 25/75 56% (pre) and 66% (post) 18% improvement   TLC 71% RV 33%  FRC 49%  RV/TLC ration 47  DLCO 121%, DLVA 145%   2 D echo 05/24/2012  EF 60-65%, normal diastolic filling  TR trace regurgitation; unable to estimate RVSP due to inadequate TR jet spectral doppler; IVC dialted; with no  significant respiratory collapse, which is consistent with R atrial pressure >20 mm HG   2 D echo 07/03/2012  Left ventricle: The cavity size was normal. Wall thickness was normal. The estimated ejection fraction was 60%. Wall motion was normal; there were no regional wall motion abnormalities. Left ventricular diastolic function parameters were normal. - Impressions: IVC is dilated with normal collapse. This suggests CVP of 6-10. Impressions: - IVC is dilated with normal collapse. This suggests CVP of 6-10.  MRI of the heart Impression:  1) Normal appearing IVC measures 1.6 cm with minor narrowing  above diaphragm. Not interrupted and normal azygous  2) Normal RV/LV EF 68%  3) No hyperenhancement of myocardium  4) No shunt by TRKS imaging  5) Normal RV and TV with no signs of cor pumonale  6) Central liver   lesion see US report by Dr Yamagata  Peter Nishan MD [facility]       Assessment & Plan:    1. Dyspnea remains of unclear etiology; methacholine challenge was negative. FEF 25-75 with a 55% decrease with methacholine; given history of detected skin allergy to mold I advised her to continue Flovent when she knows she will be exposed to mold/dust. Could have exercise induced asthma, so I advised Ms. [name] to continue albuterol prior to exercising. I suspect deconditioning also contributing to patients symptoms so I recommend incremental increase in exercise.  If despite these interventions, dyspnea persists, would recommend cardiopulmonary exercise testing.   Repeat a 2 D echo with no evidence of TR or pulmonary hypertension, but persistent IVC dilation.  MRI/MRA of the heart and large vessel normal. I do not see any evidence of pulmonary hypertension.   MIP/MEP performed but not available for my review. Were decreased in the past; it is possible but unlikely that Ms. [name] has a neuro muscular condition contributing to her symptoms. Could consider EMG in the future if this  Issue remains a concern.    2. Allergic rhinitis use Nasonex daily   3. GERD Continue Dexilant and start Zantac twice daily/   4. General health; she received a flu vaccine  5. F/U advised to schedule an appointment with our office as needed.    Eldrick Penick, MD  [facility]  [place], [place]   

## 2012-09-28 NOTE — Telephone Encounter (Signed)
No MIP/MEP was done per Marcelino Duster over at respiratory. Only Methacholine challenge was done. Please advise Dr. Frederico Hamman thanks

## 2012-09-28 NOTE — Telephone Encounter (Signed)
Ok. We will not reorder.

## 2013-01-11 ENCOUNTER — Encounter: Payer: Self-pay | Admitting: Pulmonary Disease

## 2013-07-04 ENCOUNTER — Other Ambulatory Visit: Payer: Self-pay

## 2014-04-22 ENCOUNTER — Encounter (HOSPITAL_COMMUNITY): Payer: Self-pay | Admitting: Pharmacist

## 2014-04-23 NOTE — H&P (Addendum)
Brooke [name] is an 38 y.o. female. With missed Ab presents for surgical mngt.  Pt 8 wks 4days by [place].   Past Medical History  Diagnosis Date  . Generalized anxiety disorder   . Esophageal reflux   . Unspecified sinusitis (chronic)   . Allergic rhinitis     Past Surgical History  Procedure Laterality Date  . Cystectomy      left wrist    Family History  Problem Relation Age of Onset  . Diabetes Father   . Hypertension Father   . Hypertension Mother   . Diverticulosis Mother   . Prostate cancer Paternal Uncle   . Leukemia Paternal Uncle   . Hypertension Other     sibling    Social History:  reports that she has never smoked. She has never used smokeless tobacco. She reports that she does not drink alcohol or use illicit drugs.  Allergies:  Allergies  Allergen Reactions  . Prednisone     Racing heart, unable to sleep, "feels crazy"    No prescriptions prior to admission    ROS  Physical Exam Gen - NAD Abd - soft, NT/ND Ext - NT CV - RRR Lungs - clear  [place]:  On ultrasound today, there is an intrauterine pregnancy sac with a yolk sac and fetal pole, but no cardiac activity, measuring 8 weeks 4 days.  There are 2 subchorionic hemorrhages, 1 measuring 31 x 4 mm and 1 measuring 18 x 12 mm.  No right or left adnexal masses and no free fluid seen.   Assessment/Plan:  Missed Ab D&E R/b/a discussed, questions answered, informed consent  Delrico Minehart 04/23/2014, 1:21 PM

## 2014-04-24 ENCOUNTER — Ambulatory Visit (HOSPITAL_COMMUNITY): Payer: BC Managed Care – PPO | Admitting: Certified Registered Nurse Anesthetist

## 2014-04-24 ENCOUNTER — Encounter (HOSPITAL_COMMUNITY): Payer: Self-pay | Admitting: *Deleted

## 2014-04-24 ENCOUNTER — Encounter (HOSPITAL_COMMUNITY)
Admission: RE | Disposition: A | Discharge status: Home / Self Care | Payer: Self-pay | Source: Ambulatory Visit | Attending: Obstetrics and Gynecology

## 2014-04-24 ENCOUNTER — Ambulatory Visit (HOSPITAL_COMMUNITY)
Admission: RE | Admit: 2014-04-24 | Discharge: 2014-04-24 | Disposition: A | Discharge status: Home / Self Care | Payer: BC Managed Care – PPO | Source: Ambulatory Visit | Attending: Obstetrics and Gynecology | Admitting: Obstetrics and Gynecology

## 2014-04-24 ENCOUNTER — Encounter (HOSPITAL_COMMUNITY): Payer: BC Managed Care – PPO | Admitting: Certified Registered Nurse Anesthetist

## 2014-04-24 ENCOUNTER — Ambulatory Visit (HOSPITAL_COMMUNITY): Payer: BC Managed Care – PPO

## 2014-04-24 DIAGNOSIS — O021 Missed abortion: Secondary | ICD-10-CM | POA: Insufficient documentation

## 2014-04-24 DIAGNOSIS — K219 Gastro-esophageal reflux disease without esophagitis: Secondary | ICD-10-CM | POA: Insufficient documentation

## 2014-04-24 HISTORY — PX: DILATION AND EVACUATION: SHX1459

## 2014-04-24 LAB — ABO/RH: ABO/RH(D): O POS

## 2014-04-24 LAB — CBC
HEMATOCRIT: 38 % (ref 36.0–46.0)
Hemoglobin: 13.3 g/dL (ref 12.0–15.0)
MCH: 31.4 pg (ref 26.0–34.0)
MCHC: 35 g/dL (ref 30.0–36.0)
MCV: 89.6 fL (ref 78.0–100.0)
Platelets: 195 10*3/uL (ref 150–400)
RBC: 4.24 MIL/uL (ref 3.87–5.11)
RDW: 13 % (ref 11.5–15.5)
WBC: 6.6 10*3/uL (ref 4.0–10.5)

## 2014-04-24 SURGERY — DILATION AND EVACUATION, UTERUS
Anesthesia: Monitor Anesthesia Care | Site: Vagina

## 2014-04-24 MED ORDER — IBUPROFEN 800 MG PO TABS: 800.0000 mg | ORAL_TABLET | Freq: Three times a day (TID) | ORAL | Status: DC | PRN

## 2014-04-24 MED ORDER — KETOROLAC TROMETHAMINE 30 MG/ML IJ SOLN
INTRAMUSCULAR | Status: DC | PRN
  Administered 2014-04-24: 30 mg via INTRAVENOUS

## 2014-04-24 MED ORDER — LACTATED RINGERS IV SOLN
INTRAVENOUS | Status: DC
  Administered 2014-04-24 (×2): via INTRAVENOUS

## 2014-04-24 MED ORDER — MIDAZOLAM HCL 2 MG/2ML IJ SOLN
INTRAMUSCULAR | Status: AC
  Filled 2014-04-24: qty 2

## 2014-04-24 MED ORDER — METHYLERGONOVINE MALEATE 0.2 MG PO TABS: 0.2000 mg | ORAL_TABLET | Freq: Four times a day (QID) | ORAL | Status: DC

## 2014-04-24 MED ORDER — FENTANYL CITRATE 0.05 MG/ML IJ SOLN
INTRAMUSCULAR | Status: AC
  Filled 2014-04-24: qty 5

## 2014-04-24 MED ORDER — FENTANYL CITRATE 0.05 MG/ML IJ SOLN
INTRAMUSCULAR | Status: DC | PRN
  Administered 2014-04-24 (×2): 50 ug via INTRAVENOUS

## 2014-04-24 MED ORDER — LIDOCAINE HCL (CARDIAC) 20 MG/ML IV SOLN
INTRAVENOUS | Status: DC | PRN
  Administered 2014-04-24: 20 mg via INTRAVENOUS
  Administered 2014-04-24: 50 mg via INTRAVENOUS

## 2014-04-24 MED ORDER — LIDOCAINE HCL (CARDIAC) 20 MG/ML IV SOLN
INTRAVENOUS | Status: AC
  Filled 2014-04-24: qty 5

## 2014-04-24 MED ORDER — SCOPOLAMINE 1 MG/3DAYS TD PT72
MEDICATED_PATCH | TRANSDERMAL | Status: AC
  Filled 2014-04-24: qty 1

## 2014-04-24 MED ORDER — PROMETHAZINE HCL 25 MG/ML IJ SOLN
6.2500 mg | INTRAMUSCULAR | Status: DC | PRN
  Administered 2014-04-24: 12.5 mg via INTRAVENOUS

## 2014-04-24 MED ORDER — PROMETHAZINE HCL 25 MG/ML IJ SOLN
INTRAMUSCULAR | Status: AC
Start: 2014-04-24 — End: 2014-04-24
  Administered 2014-04-24: 12.5 mg via INTRAVENOUS
  Filled 2014-04-24: qty 1

## 2014-04-24 MED ORDER — PROPOFOL INFUSION 10 MG/ML OPTIME
INTRAVENOUS | Status: DC | PRN
  Administered 2014-04-24: 100 ug/kg/min via INTRAVENOUS

## 2014-04-24 MED ORDER — DEXTROSE 5 % IV SOLN
2.0000 g | INTRAVENOUS | Status: AC
  Administered 2014-04-24: 2 g via INTRAVENOUS
  Filled 2014-04-24: qty 2

## 2014-04-24 MED ORDER — FENTANYL CITRATE 0.05 MG/ML IJ SOLN: 25.0000 ug | INTRAMUSCULAR | Status: DC | PRN

## 2014-04-24 MED ORDER — ACETAMINOPHEN 160 MG/5ML PO SOLN
975.0000 mg | Freq: Once | ORAL | Status: AC
  Administered 2014-04-24: 975 mg via ORAL

## 2014-04-24 MED ORDER — SCOPOLAMINE 1 MG/3DAYS TD PT72
1.0000 | MEDICATED_PATCH | TRANSDERMAL | Status: DC
  Administered 2014-04-24: 1.5 mg via TRANSDERMAL

## 2014-04-24 MED ORDER — 0.9 % SODIUM CHLORIDE (POUR BTL) OPTIME
TOPICAL | Status: DC | PRN
  Administered 2014-04-24: 1000 mL

## 2014-04-24 MED ORDER — MIDAZOLAM HCL 2 MG/2ML IJ SOLN
INTRAMUSCULAR | Status: DC | PRN
  Administered 2014-04-24: 2 mg via INTRAVENOUS

## 2014-04-24 MED ORDER — KETOROLAC TROMETHAMINE 30 MG/ML IJ SOLN
INTRAMUSCULAR | Status: AC
  Filled 2014-04-24: qty 1

## 2014-04-24 MED ORDER — CHLOROPROCAINE HCL 1 % IJ SOLN
INTRAMUSCULAR | Status: AC
  Filled 2014-04-24: qty 30

## 2014-04-24 MED ORDER — CHLOROPROCAINE HCL 1 % IJ SOLN
INTRAMUSCULAR | Status: DC | PRN
  Administered 2014-04-24: 10 mL

## 2014-04-24 MED ORDER — OXYCODONE-ACETAMINOPHEN 5-325 MG PO TABS: 1.0000 | ORAL_TABLET | ORAL | Status: DC | PRN

## 2014-04-24 MED ORDER — PROPOFOL 10 MG/ML IV EMUL
INTRAVENOUS | Status: AC
  Filled 2014-04-24: qty 20

## 2014-04-24 MED ORDER — ACETAMINOPHEN 160 MG/5ML PO SOLN
ORAL | Status: AC
  Filled 2014-04-24: qty 40.6

## 2014-04-24 SURGICAL SUPPLY — 18 items
CATH ROBINSON RED A/P 16FR (CATHETERS) ×2 IMPLANT
CLOTH BEACON ORANGE TIMEOUT ST (SAFETY) ×2 IMPLANT
DECANTER SPIKE VIAL GLASS SM (MISCELLANEOUS) IMPLANT
GLOVE BIO SURGEON STRL SZ 6.5 (GLOVE) ×2 IMPLANT
GLOVE BIOGEL PI IND STRL 7.0 (GLOVE) ×1 IMPLANT
GLOVE BIOGEL PI INDICATOR 7.0 (GLOVE) ×1
GOWN STRL REUS W/TWL LRG LVL3 (GOWN DISPOSABLE) ×4 IMPLANT
KIT BERKELEY 1ST TRIMESTER 3/8 (MISCELLANEOUS) ×2 IMPLANT
NS IRRIG 1000ML POUR BTL (IV SOLUTION) ×2 IMPLANT
PACK VAGINAL MINOR WOMEN LF (CUSTOM PROCEDURE TRAY) ×2 IMPLANT
PAD OB MATERNITY 4.3X12.25 (PERSONAL CARE ITEMS) ×2 IMPLANT
PAD PREP 24X48 CUFFED NSTRL (MISCELLANEOUS) ×2 IMPLANT
SET BERKELEY SUCTION TUBING (SUCTIONS) ×2 IMPLANT
TOWEL OR 17X24 6PK STRL BLUE (TOWEL DISPOSABLE) ×4 IMPLANT
VACURETTE 10 RIGID CVD (CANNULA) IMPLANT
VACURETTE 7MM CVD STRL WRAP (CANNULA) IMPLANT
VACURETTE 8 RIGID CVD (CANNULA) ×2 IMPLANT
VACURETTE 9 RIGID CVD (CANNULA) ×2 IMPLANT

## 2014-04-24 NOTE — Anesthesia Postprocedure Evaluation (Signed)
  Anesthesia Post-op Note  Anesthesia Post Note  Patient: Brooke [name]  Procedure(s) Performed: Procedure(s) (LRB): DILATATION AND EVACUATION WITH ULTRASOUND GUIDANCE  (N/A)  Anesthesia type: MAC  Patient location: PACU  Post pain: Pain level controlled  Post assessment: Post-op Vital signs reviewed  Last Vitals:  Filed Vitals:   04/24/14 1445  BP: 102/65  Pulse: 86  Temp:   Resp: 19    Post vital signs: Reviewed  Level of consciousness: sedated  Complications: No apparent anesthesia complications

## 2014-04-24 NOTE — Anesthesia Preprocedure Evaluation (Signed)
Anesthesia Evaluation  Patient identified by MRN, date of birth, ID band Patient awake    Reviewed: Allergy & Precautions, H&P , Patient's Chart, lab work & pertinent test results, reviewed documented beta blocker date and time   Airway Mallampati: II TM Distance: >3 FB Neck ROM: full    Dental no notable dental hx.    Pulmonary  breath sounds clear to auscultation  Pulmonary exam normal       Cardiovascular Rhythm:regular Rate:Normal     Neuro/Psych    GI/Hepatic GERD-  Medicated,  Endo/Other    Renal/GU      Musculoskeletal   Abdominal   Peds  Hematology   Anesthesia Other Findings   Reproductive/Obstetrics                           Anesthesia Physical Anesthesia Plan  ASA: II  Anesthesia Plan: MAC   Post-op Pain Management:    Induction: Intravenous  Airway Management Planned: LMA, Mask and Natural Airway  Additional Equipment:   Intra-op Plan:   Post-operative Plan:   Informed Consent: I have reviewed the patients History and Physical, chart, labs and discussed the procedure including the risks, benefits and alternatives for the proposed anesthesia with the patient or authorized representative who has indicated his/her understanding and acceptance.   Dental Advisory Given  Plan Discussed with: CRNA and Surgeon  Anesthesia Plan Comments: (Discussed sedation and potential to need to place airway or ETT if warranted by clinical changes intra-operatively. We will start procedure as MAC.)        Anesthesia Quick Evaluation

## 2014-04-24 NOTE — Discharge Instructions (Signed)
DISCHARGE INSTRUCTIONS:  D&E The following instructions have been prepared to help you care for yourself upon your return home.  MAY TAKE IBUPROFEN (MOTRIN, ADVIL) OR ALEVE AFTER 8:00 PM FOR CRAMPS!!!   Personal hygiene:  Use sanitary pads for vaginal drainage, not tampons.  Shower the day after your procedure.  NO tub baths, pools or Jacuzzis for 2-3 weeks.  Wipe front to back after using the bathroom.  Activity and limitations:  Do NOT drive or operate any equipment for 24 hours. The effects of anesthesia are still present and drowsiness may result.  Do NOT rest in bed all day.  Walking is encouraged.  Walk up and down stairs slowly.  You may resume your normal activity in one to two days or as indicated by your physician.  Sexual activity: NO intercourse for at least 2 weeks after the procedure, or as indicated by your physician.  Diet: Eat a light meal as desired this evening. You may resume your usual diet tomorrow.  Return to work: You may resume your work activities in one to two days or as indicated by your doctor.  What to expect after your surgery: Expect to have vaginal bleeding/discharge for 2-3 days and spotting for up to 10 days. It is not unusual to have soreness for up to 1-2 weeks. You may have a slight burning sensation when you urinate for the first day. Mild cramps may continue for a couple of days. You may have a regular period in 2-6 weeks.  Call your doctor for any of the following:  Excessive vaginal bleeding, saturating and changing one pad every hour.  Inability to urinate 6 hours after discharge from hospital.  Pain not relieved by pain medication.  Fever of 100.4 F or greater.  Unusual vaginal discharge or odor.   Call for an appointment:    Patients signature: ______________________  Nurses signature ________________________  Support person's signature_______________________

## 2014-04-24 NOTE — Op Note (Signed)
[name], [name] NO.:  [id number]  MEDICAL RECORD NO.:  [id number]  LOCATION:  [facility]                          FACILITY:  [facility]  PHYSICIAN:  Marylynn Pearson, MD    DATE OF BIRTH:  [birth year]  DATE OF PROCEDURE:  04/24/2014 DATE OF DISCHARGE:  04/24/2014                              OPERATIVE REPORT   PREOPERATIVE DIAGNOSIS:  Missed abortion.  POSTOPERATIVE DIAGNOSIS:  Missed abortion.  PROCEDURE: 1. Cervical block 2. Dilation and evacuation.  SURGEON:  Marylynn Pearson, MD.  ANESTHESIA:  MAC with local.  DESCRIPTION OF PROCEDURE:  The patient was taken to the operating room. After anesthesia was adequate, she was placed in the dorsal lithotomy position using Allen stirrups.  Prepped and draped in sterile fashion. In and out catheter was used to drain her bladder.  Bivalve speculum was placed in the vagina.  The cervix did not require dilation.  An 8-French suction catheter was inserted into the endometrium and products of conception were evacuated from the uterine cavity.  Good suction was not acquired with the 8-French catheter, therefore a 9-French catheter was then used.  Suction was better and more products of conception were removed.  A gentle curetting was then performed with a curette.  She did have some irregularity to her uterine cavity in the left fundus, therefore ultrasound was called into the room.  No further tissue could be obtained with curette or suction.  Upon ultrasound, the endometrium appeared thin without evidence of products of conception.  All instruments were then removed from the vagina.  Tenaculum was removed from the cervix.  She was taken to the recovery room in stable condition.  Sponge, lap, needle, instrument counts were correct x2.     Marylynn Pearson, MD     GA/MEDQ  D:  04/24/2014  T:  04/24/2014  Job:  [id number]

## 2014-04-24 NOTE — Transfer of Care (Signed)
Immediate Anesthesia Transfer of Care Note  Patient: Brooke [name]  Procedure(s) Performed: Procedure(s): DILATATION AND EVACUATION WITH ULTRASOUND GUIDANCE  (N/A)  Patient Location: PACU  Anesthesia Type:MAC  Level of Consciousness: awake, alert , oriented and patient cooperative  Airway & Oxygen Therapy: Patient Spontanous Breathing  Post-op Assessment: Report given to PACU RN and Post -op Vital signs reviewed and stable  Post vital signs: Reviewed and stable  Complications: No apparent anesthesia complications

## 2014-04-26 ENCOUNTER — Encounter (HOSPITAL_COMMUNITY): Payer: Self-pay | Admitting: Obstetrics and Gynecology

## 2014-04-27 ENCOUNTER — Encounter (HOSPITAL_COMMUNITY): Payer: Self-pay | Admitting: *Deleted

## 2014-04-27 ENCOUNTER — Inpatient Hospital Stay (HOSPITAL_COMMUNITY)
Admission: AD | Admit: 2014-04-27 | Discharge: 2014-04-27 | Disposition: A | Discharge status: Home / Self Care | Payer: BC Managed Care – PPO | Source: Ambulatory Visit | Attending: Obstetrics and Gynecology | Admitting: Obstetrics and Gynecology

## 2014-04-27 DIAGNOSIS — R42 Dizziness and giddiness: Secondary | ICD-10-CM | POA: Diagnosis not present

## 2014-04-27 DIAGNOSIS — H6593 Unspecified nonsuppurative otitis media, bilateral: Secondary | ICD-10-CM

## 2014-04-27 DIAGNOSIS — J019 Acute sinusitis, unspecified: Secondary | ICD-10-CM | POA: Insufficient documentation

## 2014-04-27 DIAGNOSIS — J018 Other acute sinusitis: Secondary | ICD-10-CM

## 2014-04-27 DIAGNOSIS — H6640 Suppurative otitis media, unspecified, unspecified ear: Secondary | ICD-10-CM | POA: Diagnosis not present

## 2014-04-27 HISTORY — DX: Other specified postprocedural states: Z98.890

## 2014-04-27 HISTORY — DX: Nausea with vomiting, unspecified: R11.2

## 2014-04-27 LAB — URINALYSIS, ROUTINE W REFLEX MICROSCOPIC
Bilirubin Urine: NEGATIVE
GLUCOSE, UA: NEGATIVE mg/dL
Ketones, ur: NEGATIVE mg/dL
Leukocytes, UA: NEGATIVE
Nitrite: NEGATIVE
PH: 6.5 (ref 5.0–8.0)
Protein, ur: NEGATIVE mg/dL
Specific Gravity, Urine: 1.01 (ref 1.005–1.030)
Urobilinogen, UA: 0.2 mg/dL (ref 0.0–1.0)

## 2014-04-27 LAB — CBC
HEMATOCRIT: 37.1 % (ref 36.0–46.0)
HEMOGLOBIN: 12.5 g/dL (ref 12.0–15.0)
MCH: 30.4 pg (ref 26.0–34.0)
MCHC: 33.7 g/dL (ref 30.0–36.0)
MCV: 90.3 fL (ref 78.0–100.0)
Platelets: 182 10*3/uL (ref 150–400)
RBC: 4.11 MIL/uL (ref 3.87–5.11)
RDW: 12.9 % (ref 11.5–15.5)
WBC: 6.1 10*3/uL (ref 4.0–10.5)

## 2014-04-27 LAB — URINE MICROSCOPIC-ADD ON

## 2014-04-27 NOTE — MAU Note (Signed)
Pt presents to MAU with complaints of feeling dizzy and week, reports D&E on Thursday August the 27th.

## 2014-04-27 NOTE — Discharge Instructions (Signed)
Dizziness °Dizziness is a common problem. It is a feeling of unsteadiness or light-headedness. You may feel like you are about to faint. Dizziness can lead to injury if you stumble or fall. A person of any age group can suffer from dizziness, but dizziness is more common in older adults. °CAUSES  °Dizziness can be caused by many different things, including: °· Middle ear problems. °· Standing for too long. °· Infections. °· An allergic reaction. °· Aging. °· An emotional response to something, such as the sight of blood. °· Side effects of medicines. °· Tiredness. °· Problems with circulation or blood pressure. °· Excessive use of alcohol or medicines, or illegal drug use. °· Breathing too fast (hyperventilation). °· An irregular heart rhythm (arrhythmia). °· A low red blood cell count (anemia). °· Pregnancy. °· Vomiting, diarrhea, fever, or other illnesses that cause body fluid loss (dehydration). °· Diseases or conditions such as Parkinson's disease, high blood pressure (hypertension), diabetes, and thyroid problems. °· Exposure to extreme heat. °DIAGNOSIS  °Your health care provider will ask about your symptoms, perform a physical exam, and perform an electrocardiogram (ECG) to record the electrical activity of your heart. Your health care provider may also perform other heart or blood tests to determine the cause of your dizziness. These may include: °· Transthoracic echocardiogram (TTE). During echocardiography, sound waves are used to evaluate how blood flows through your heart. °· Transesophageal echocardiogram (TEE). °· Cardiac monitoring. This allows your health care provider to monitor your heart rate and rhythm in real time. °· Holter monitor. This is a portable device that records your heartbeat and can help diagnose heart arrhythmias. It allows your health care provider to track your heart activity for several days if needed. °· Stress tests by exercise or by giving medicine that makes the heart beat  faster. °TREATMENT  °Treatment of dizziness depends on the cause of your symptoms and can vary greatly. °HOME CARE INSTRUCTIONS  °· Drink enough fluids to keep your urine clear or pale yellow. This is especially important in very hot weather. In older adults, it is also important in cold weather. °· Take your medicine exactly as directed if your dizziness is caused by medicines. When taking blood pressure medicines, it is especially important to get up slowly. °¨ Rise slowly from chairs and steady yourself until you feel okay. °¨ In the morning, first sit up on the side of the bed. When you feel okay, stand slowly while holding onto something until you know your balance is fine. °· Move your legs often if you need to stand in one place for a long time. Tighten and relax your muscles in your legs while standing. °· Have someone stay with you for 1-2 days if dizziness continues to be a problem. Do this until you feel you are well enough to stay alone. Have the person call your health care provider if he or she notices changes in you that are concerning. °· Do not drive or use heavy machinery if you feel dizzy. °· Do not drink alcohol. °SEEK IMMEDIATE MEDICAL CARE IF:  °· Your dizziness or light-headedness gets worse. °· You feel nauseous or vomit. °· You have problems talking, walking, or using your arms, hands, or legs. °· You feel weak. °· You are not thinking clearly or you have trouble forming sentences. It may take a friend or family member to notice this. °· You have chest pain, abdominal pain, shortness of breath, or sweating. °· Your vision changes. °· You notice   any bleeding. °· You have side effects from medicine that seems to be getting worse rather than better. °MAKE SURE YOU:  °· Understand these instructions. °· Will watch your condition. °· Will get help right away if you are not doing well or get worse. °Document Released: 02/08/2001 Document Revised: 08/20/2013 Document Reviewed: 03/04/2011 °ExitCare®  Patient Information ©2015 [employer]. This information is not intended to replace advice given to you by your health care provider. Make sure you discuss any questions you have with your health care provider. ° °Sinusitis °Sinusitis is redness, soreness, and inflammation of the paranasal sinuses. Paranasal sinuses are air pockets within the bones of your face (beneath the eyes, the middle of the forehead, or above the eyes). In healthy paranasal sinuses, mucus is able to drain out, and air is able to circulate through them by way of your nose. However, when your paranasal sinuses are inflamed, mucus and air can become trapped. This can allow bacteria and other germs to grow and cause infection. °Sinusitis can develop quickly and last only a short time (acute) or continue over a long period (chronic). Sinusitis that lasts for more than 12 weeks is considered chronic.  °CAUSES  °Causes of sinusitis include: °· Allergies. °· Structural abnormalities, such as displacement of the cartilage that separates your nostrils (deviated septum), which can decrease the air flow through your nose and sinuses and affect sinus drainage. °· Functional abnormalities, such as when the small hairs (cilia) that line your sinuses and help remove mucus do not work properly or are not present. °SIGNS AND SYMPTOMS  °Symptoms of acute and chronic sinusitis are the same. The primary symptoms are pain and pressure around the affected sinuses. Other symptoms include: °· Upper toothache. °· Earache. °· Headache. °· Bad breath. °· Decreased sense of smell and taste. °· A cough, which worsens when you are lying flat. °· Fatigue. °· Fever. °· Thick drainage from your nose, which often is green and may contain pus (purulent). °· Swelling and warmth over the affected sinuses. °DIAGNOSIS  °Your health care provider will perform a physical exam. During the exam, your health care provider may: °· Look in your nose for signs of abnormal growths in your  nostrils (nasal polyps). °· Tap over the affected sinus to check for signs of infection. °· View the inside of your sinuses (endoscopy) using an imaging device that has a light attached (endoscope). °If your health care provider suspects that you have chronic sinusitis, one or more of the following tests may be recommended: °· Allergy tests. °· Nasal culture. A sample of mucus is taken from your nose, sent to a lab, and screened for bacteria. °· Nasal cytology. A sample of mucus is taken from your nose and examined by your health care provider to determine if your sinusitis is related to an allergy. °TREATMENT  °Most cases of acute sinusitis are related to a viral infection and will resolve on their own within 10 days. Sometimes medicines are prescribed to help relieve symptoms (pain medicine, decongestants, nasal steroid sprays, or saline sprays).  °However, for sinusitis related to a bacterial infection, your health care provider will prescribe antibiotic medicines. These are medicines that will help kill the bacteria causing the infection.  °Rarely, sinusitis is caused by a fungal infection. In theses cases, your health care provider will prescribe antifungal medicine. °For some cases of chronic sinusitis, surgery is needed. Generally, these are cases in which sinusitis recurs more than 3 times per year, despite other   treatments. °HOME CARE INSTRUCTIONS  °· Drink plenty of water. Water helps thin the mucus so your sinuses can drain more easily. °· Use a humidifier. °· Inhale steam 3 to 4 times a day (for example, sit in the bathroom with the shower running). °· Apply a warm, moist washcloth to your face 3 to 4 times a day, or as directed by your health care provider. °· Use saline nasal sprays to help moisten and clean your sinuses. °· Take medicines only as directed by your health care provider. °· If you were prescribed either an antibiotic or antifungal medicine, finish it all even if you start to feel  better. °SEEK IMMEDIATE MEDICAL CARE IF: °· You have increasing pain or severe headaches. °· You have nausea, vomiting, or drowsiness. °· You have swelling around your face. °· You have vision problems. °· You have a stiff neck. °· You have difficulty breathing. °MAKE SURE YOU:  °· Understand these instructions. °· Will watch your condition. °· Will get help right away if you are not doing well or get worse. °Document Released: 08/15/2005 Document Revised: 12/30/2013 Document Reviewed: 08/30/2011 °ExitCare® Patient Information ©2015 [employer]. This information is not intended to replace advice given to you by your health care provider. Make sure you discuss any questions you have with your health care provider. ° °

## 2014-04-27 NOTE — MAU Provider Note (Signed)
Chief Complaint:  Fatigue and Dizziness   First Provider Initiated Contact with Patient 04/27/14 1639     HPI: Brooke [name] is a 38 y.o. 3 days S/P  D&C for missed AB who presents to maternity admissions reporting "feeling as if her equilibrium has been off" since last night. Scant bleeding since D&C. Denies fever, chills, abdominal pain. Reports sinus pressure and pressure under her ears since a few days before the D&C, worse over the past few days.   Past Medical History: Past Medical History  Diagnosis Date  . Generalized anxiety disorder   . Esophageal reflux   . Unspecified sinusitis (chronic)   . Allergic rhinitis   . PONV (postoperative nausea and vomiting)     phenergan in iv before dc    Past obstetric history: OB History  Gravida Para Term Preterm AB SAB TAB Ectopic Multiple Living  3         1    # Outcome Date GA Lbr Len/2nd Weight Sex Delivery Anes PTL Lv  3 GRA           2 GRA           1 GRA               Past Surgical History: Past Surgical History  Procedure Laterality Date  . Cystectomy      left wrist  . Dilation and evacuation N/A 04/24/2014    Procedure: DILATATION AND EVACUATION WITH ULTRASOUND GUIDANCE ;  Surgeon: Marylynn Pearson, MD;  Location: [facility];  Service: Gynecology;  Laterality: N/A;     Family History: Family History  Problem Relation Age of Onset  . Diabetes Father   . Hypertension Father   . Hypertension Mother   . Diverticulosis Mother   . Prostate cancer Paternal Uncle   . Leukemia Paternal Uncle   . Hypertension Other     sibling    Social History: History  Substance Use Topics  . Smoking status: Never Smoker   . Smokeless tobacco: Never Used  . Alcohol Use: No    Allergies:  Allergies  Allergen Reactions  . Prednisone Other (See Comments)    Pt states that her heart races, she is unable to sleep, and it makes her "feel crazy".     Meds:  Prescriptions prior to admission  Medication Sig Dispense Refill  .  dexlansoprazole (DEXILANT) 60 MG capsule Take 60 mg by mouth at bedtime.       . Prenatal Vit-Fe Fumarate-FA (PRENATAL MULTIVITAMIN) TABS tablet Take 1 tablet by mouth daily.         ROS: Pertinent findings in history of present illness.  Physical Exam  Blood pressure 111/76, pulse 70, temperature 98.7 F (37.1 C), resp. rate 18, last menstrual period 01/02/2014. Patient Vitals for the past 24 hrs:  BP Temp Pulse Resp  04/27/14 1645 111/76 mmHg - 70 18  04/27/14 1643 119/80 mmHg - 69 18  04/27/14 1641 119/74 mmHg - 89 18  04/27/14 1639 113/78 mmHg - 93 18  04/27/14 1537 129/76 mmHg 98.7 F (37.1 C) 76 18   GENERAL: Well-developed, well-nourished female in no acute distress.  HEENT: normocephalic. Small effusion bilaterally without bulging of tympanic membranes or erythema in. Normal inner ear landmarks. HEART: normal rate and rhythm RESP: normal effort ABDOMEN: Soft, non-tender.  EXTREMITIES: Nontender, no edema NEURO: alert and oriented SPECULUM EXAM: Deferred     Labs: Results for orders placed during the hospital encounter of 04/27/14 (from the past  24 hour(s))  URINALYSIS, ROUTINE W REFLEX MICROSCOPIC     Status: Abnormal   Collection Time    04/27/14  3:40 PM      Result Value Ref Range   Color, Urine YELLOW  YELLOW   APPearance CLEAR  CLEAR   Specific Gravity, Urine 1.010  1.005 - 1.030   pH 6.5  5.0 - 8.0   Glucose, UA NEGATIVE  NEGATIVE mg/dL   Hgb urine dipstick SMALL (*) NEGATIVE   Bilirubin Urine NEGATIVE  NEGATIVE   Ketones, ur NEGATIVE  NEGATIVE mg/dL   Protein, ur NEGATIVE  NEGATIVE mg/dL   Urobilinogen, UA 0.2  0.0 - 1.0 mg/dL   Nitrite NEGATIVE  NEGATIVE   Leukocytes, UA NEGATIVE  NEGATIVE  URINE MICROSCOPIC-ADD ON     Status: Abnormal   Collection Time    04/27/14  3:40 PM      Result Value Ref Range   Squamous Epithelial / LPF FEW (*) RARE   WBC, UA 0-2  <3 WBC/hpf   RBC / HPF 3-6  <3 RBC/hpf   Urine-Other MUCOUS PRESENT    CBC     Status: None    Collection Time    04/27/14  4:00 PM      Result Value Ref Range   WBC 6.1  4.0 - 10.5 K/uL   RBC 4.11  3.87 - 5.11 MIL/uL   Hemoglobin 12.5  12.0 - 15.0 g/dL   HCT 37.1  36.0 - 46.0 %   MCV 90.3  78.0 - 100.0 fL   MCH 30.4  26.0 - 34.0 pg   MCHC 33.7  30.0 - 36.0 g/dL   RDW 12.9  11.5 - 15.5 %   Platelets 182  150 - 400 K/uL    Imaging:  NA  MAU Course:   Assessment: 1. Dizziness   2. Other acute sinusitis   3. Middle ear effusion, bilateral     Plan: Discharge home in stable condition per consult with Dr. Helane Rima. Increase fluids. Change positions carefully. Followup with primary care provider if symptoms worsen over the next week or fever greater than 100.4.     Follow-up Information   Follow up with [facility] [place]. In 2 weeks. (As scheduled or sooner as needed if symptoms worsen)    Contact information:   [place] [address] [phone]      Follow up with [facility]. (As needed in emergencies)    Contact information:   [address] [id number] [place] [address] [phone]       Medication List         DEXILANT 60 MG capsule  Generic drug:  dexlansoprazole  Take 60 mg by mouth at bedtime.     prenatal multivitamin Tabs tablet  Take 1 tablet by mouth daily.        Manya Silvas, CNM 04/27/2014 5:15 PM ,

## 2014-04-27 NOTE — MAU Note (Signed)
August 27th had D & C. Complaints of foggy feeling and leg weakness that began yesterday evening.Pt. Also states she has a lot of sinus congestion and pain in her face that is new. Also, had neck pain yesterday. Denies heavy bleeding. States bleeding is intermittent and a small amount of light red blood. Denies any other discharge. Denies fever.

## 2014-06-30 ENCOUNTER — Encounter (HOSPITAL_COMMUNITY): Payer: Self-pay | Admitting: *Deleted

## 2014-08-24 ENCOUNTER — Inpatient Hospital Stay (HOSPITAL_COMMUNITY): Payer: BC Managed Care – PPO

## 2014-08-24 ENCOUNTER — Encounter (HOSPITAL_COMMUNITY): Payer: Self-pay | Admitting: *Deleted

## 2014-08-24 ENCOUNTER — Inpatient Hospital Stay (HOSPITAL_COMMUNITY)
Admission: AD | Admit: 2014-08-24 | Discharge: 2014-08-24 | Disposition: A | Discharge status: Home / Self Care | Payer: BC Managed Care – PPO | Source: Ambulatory Visit | Attending: Obstetrics and Gynecology | Admitting: Obstetrics and Gynecology

## 2014-08-24 DIAGNOSIS — O9989 Other specified diseases and conditions complicating pregnancy, childbirth and the puerperium: Secondary | ICD-10-CM | POA: Insufficient documentation

## 2014-08-24 DIAGNOSIS — R109 Unspecified abdominal pain: Secondary | ICD-10-CM | POA: Diagnosis present

## 2014-08-24 DIAGNOSIS — M549 Dorsalgia, unspecified: Secondary | ICD-10-CM | POA: Diagnosis not present

## 2014-08-24 DIAGNOSIS — O0281 Inappropriate change in quantitative human chorionic gonadotropin (hCG) in early pregnancy: Secondary | ICD-10-CM | POA: Diagnosis not present

## 2014-08-24 DIAGNOSIS — O26899 Other specified pregnancy related conditions, unspecified trimester: Secondary | ICD-10-CM

## 2014-08-24 LAB — CBC
HEMATOCRIT: 37.9 % (ref 36.0–46.0)
Hemoglobin: 12.7 g/dL (ref 12.0–15.0)
MCH: 28.7 pg (ref 26.0–34.0)
MCHC: 33.5 g/dL (ref 30.0–36.0)
MCV: 85.6 fL (ref 78.0–100.0)
Platelets: 166 10*3/uL (ref 150–400)
RBC: 4.43 MIL/uL (ref 3.87–5.11)
RDW: 14 % (ref 11.5–15.5)
WBC: 5.1 10*3/uL (ref 4.0–10.5)

## 2014-08-24 LAB — URINALYSIS, ROUTINE W REFLEX MICROSCOPIC
Bilirubin Urine: NEGATIVE
Glucose, UA: NEGATIVE mg/dL
Ketones, ur: NEGATIVE mg/dL
NITRITE: NEGATIVE
PROTEIN: NEGATIVE mg/dL
Specific Gravity, Urine: <1.005 — ABNORMAL LOW (ref 1.005–1.030)
Urobilinogen, UA: 0.2 mg/dL (ref 0.0–1.0)
pH: 6 (ref 5.0–8.0)

## 2014-08-24 LAB — POCT PREGNANCY, URINE: Preg Test, Ur: POSITIVE — AB

## 2014-08-24 LAB — URINE MICROSCOPIC-ADD ON

## 2014-08-24 LAB — HCG, QUANTITATIVE, PREGNANCY: HCG, BETA CHAIN, QUANT, S: 13027 m[IU]/mL — AB (ref <5)

## 2014-08-24 NOTE — MAU Note (Signed)
Pt presents to MAU with complaints of sharp pain in her back this morning with some mild abdominal cramping yesterday. 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 + pregnancy test at the first of December with LMP  06/23/14. HCG levels are doubling but last U/S showed no pregnancy at that time.

## 2014-08-24 NOTE — MAU Provider Note (Signed)
History     CSN: [id number]  Arrival date and time: 08/24/14 1118   First Provider Initiated Contact with Patient 08/24/14 1219      Chief Complaint  Patient presents with  . Abdominal Pain  . Back Pain   HPI  Brooke [name]  38 y.o.  Patient of Dr. Carren Rang.  Has had 2 previous miscarriages.  Was seen in the office last on 08-20-14.  Has an appointment tomorrow.  This is a wanted pregnancy but nothing has been seen on ultrasound.  Today she is having cramping and back pain and is worried that this will be another miscarriage.  Called the office and came in for evaluation.   OB History    Gravida Para Term Preterm AB TAB SAB Ectopic Multiple Living   4         1      Past Medical History  Diagnosis Date  . Generalized anxiety disorder   . Esophageal reflux   . Unspecified sinusitis (chronic)   . Allergic rhinitis   . PONV (postoperative nausea and vomiting)     phenergan in iv before dc    Past Surgical History  Procedure Laterality Date  . Cystectomy      left wrist  . Dilation and evacuation N/A 04/24/2014    Procedure: DILATATION AND EVACUATION WITH ULTRASOUND GUIDANCE ;  Surgeon: Marylynn Pearson, MD;  Location: [facility];  Service: Gynecology;  Laterality: N/A;    Family History  Problem Relation Age of Onset  . Diabetes Father   . Hypertension Father   . Hypertension Mother   . Diverticulosis Mother   . Prostate cancer Paternal Uncle   . Leukemia Paternal Uncle   . Hypertension Other     sibling    History  Substance Use Topics  . Smoking status: Never Smoker   . Smokeless tobacco: Never Used  . Alcohol Use: No    Allergies:  Allergies  Allergen Reactions  . Prednisone Other (See Comments)    Pt states that her heart races, she is unable to sleep, and it makes her "feel crazy".     Prescriptions prior to admission  Medication Sig Dispense Refill Last Dose  . Prenatal Vit-Fe Fumarate-FA (PRENATAL MULTIVITAMIN) TABS tablet Take 1 tablet by mouth  daily.    08/23/2014 at Unknown time  . progesterone 200 MG SUPP Place 200 mg vaginally 2 (two) times daily.   08/22/2014 at Unknown time  . RABEprazole (ACIPHEX) 20 MG tablet Take 20 mg by mouth daily.   08/23/2014 at Unknown time    ROS Physical Exam   Blood pressure 115/67, pulse 87, temperature 98.1 F (36.7 C), resp. rate 18, last menstrual period 06/23/2014.  Physical Exam  Nursing note and vitals reviewed. Constitutional: She is oriented to person, place, and time. She appears well-developed and well-nourished.  Able to move in bed without assistance from lying to a sitting position.  HENT:  Head: Normocephalic.  Eyes: EOM are normal.  Neck: Neck supple.  Musculoskeletal: Normal range of motion.  Neurological: She is alert and oriented to person, place, and time.  Skin: Skin is warm and dry.  Psychiatric: She has a normal mood and affect.    MAU Course  Procedures Results for orders placed or performed during the hospital encounter of 08/24/14 (from the past 24 hour(s))  Urinalysis, Routine w reflex microscopic     Status: Abnormal   Collection Time: 08/24/14 11:23 AM  Result Value Ref Range   Color,  Urine YELLOW YELLOW   APPearance CLEAR CLEAR   Specific Gravity, Urine <1.005 (L) 1.005 - 1.030   pH 6.0 5.0 - 8.0   Glucose, UA NEGATIVE NEGATIVE mg/dL   Hgb urine dipstick TRACE (A) NEGATIVE   Bilirubin Urine NEGATIVE NEGATIVE   Ketones, ur NEGATIVE NEGATIVE mg/dL   Protein, ur NEGATIVE NEGATIVE mg/dL   Urobilinogen, UA 0.2 0.0 - 1.0 mg/dL   Nitrite NEGATIVE NEGATIVE   Leukocytes, UA SMALL (A) NEGATIVE  Urine microscopic-add on     Status: None   Collection Time: 08/24/14 11:23 AM  Result Value Ref Range   Squamous Epithelial / LPF RARE RARE   WBC, UA 0-2 <3 WBC/hpf   RBC / HPF 0-2 <3 RBC/hpf  Pregnancy, urine POC     Status: Abnormal   Collection Time: 08/24/14 11:29 AM  Result Value Ref Range   Preg Test, Ur POSITIVE (A) NEGATIVE  hCG, quantitative,  pregnancy     Status: Abnormal   Collection Time: 08/24/14 12:24 PM  Result Value Ref Range   hCG, Beta Chain, Quant, S 13027 (H) <5 mIU/mL  CBC     Status: None   Collection Time: 08/24/14 12:24 PM  Result Value Ref Range   WBC 5.1 4.0 - 10.5 K/uL   RBC 4.43 3.87 - 5.11 MIL/uL   Hemoglobin 12.7 12.0 - 15.0 g/dL   HCT 37.9 36.0 - 46.0 %   MCV 85.6 78.0 - 100.0 fL   MCH 28.7 26.0 - 34.0 pg   MCHC 33.5 30.0 - 36.0 g/dL   RDW 14.0 11.5 - 15.5 %   Platelets 166 150 - 400 K/uL    MDM CLINICAL DATA: Abdominal and back pain. Quantitative beta HCG pending. Estimated gestational age [redacted] weeks 6 days per LMP. History of spontaneous abortion twice in 2015.  EXAM: OBSTETRIC <14 WK [place] AND TRANSVAGINAL OB US  TECHNIQUE: Both transabdominal and transvaginal ultrasound examinations were performed for complete evaluation of the gestation as well as the maternal uterus, adnexal regions, and pelvic cul-de-sac. Transvaginal technique was performed to assess early pregnancy.  COMPARISON: None.  FINDINGS: Intrauterine gestational sac: Somewhat elongated gestational sac without decidual reaction. Adjacent clustered cystic change over the endometrium which may represent day subchorionic hemorrhage and measures 1.2 cm in greatest diameter.  Yolk sac: Not visualized.  Embryo: Not visualized.  Cardiac Activity: Not visualized.  Heart Rate: Not visualized.  MSD: 9.4 mm 5 w 4 d [place] EDC: 04/22/2015  Maternal uterus/adnexae: 1.5 cm fibroid over the fundus. Right ovarian corpus luteal cyst present. Ovaries although otherwise unremarkable. No free pelvic fluid.  IMPRESSION: Findings suspicious for failed pregnancy, although cannot exclude normal early pregnancy. Recommend correlation with serial followup quantitative beta HCG and ultrasound as indicated.   1445  Discussed ultrasound and lab results with Dr. Corinna Capra.  In agreement with plan to follow up with Dr. Carren Rang at  her appointment in the office tomorrow.  Assessment and Plan  Inappropriate rise in quant.  IUP not fully identifiable on [place] as there is no yolk sac.  Plan Follow up in the office tomorrow. Would not be surprising if she has more cramping or vaginal bleeding - would indicate this pregnancy may end in miscarriage.  BURLESON,TERRI 08/24/2014, 12:25 PM

## 2014-08-24 NOTE — Discharge Instructions (Signed)
Keep your appointment in the office tomorrow. You may have vaginal bleeding if this will be a miscarriage.

## 2014-09-04 ENCOUNTER — Emergency Department (HOSPITAL_COMMUNITY)
Admission: EM | Admit: 2014-09-04 | Discharge: 2014-09-04 | Disposition: A | Discharge status: Home / Self Care | Payer: BC Managed Care – PPO | Attending: Emergency Medicine | Admitting: Emergency Medicine

## 2014-09-04 ENCOUNTER — Emergency Department (HOSPITAL_COMMUNITY): Payer: BC Managed Care – PPO

## 2014-09-04 ENCOUNTER — Encounter (HOSPITAL_COMMUNITY): Payer: Self-pay | Admitting: Emergency Medicine

## 2014-09-04 DIAGNOSIS — K219 Gastro-esophageal reflux disease without esophagitis: Secondary | ICD-10-CM | POA: Insufficient documentation

## 2014-09-04 DIAGNOSIS — Z90710 Acquired absence of both cervix and uterus: Secondary | ICD-10-CM | POA: Insufficient documentation

## 2014-09-04 DIAGNOSIS — Z8709 Personal history of other diseases of the respiratory system: Secondary | ICD-10-CM | POA: Diagnosis not present

## 2014-09-04 DIAGNOSIS — Z79899 Other long term (current) drug therapy: Secondary | ICD-10-CM | POA: Diagnosis not present

## 2014-09-04 DIAGNOSIS — Z8659 Personal history of other mental and behavioral disorders: Secondary | ICD-10-CM | POA: Insufficient documentation

## 2014-09-04 DIAGNOSIS — K5732 Diverticulitis of large intestine without perforation or abscess without bleeding: Secondary | ICD-10-CM | POA: Insufficient documentation

## 2014-09-04 DIAGNOSIS — Z792 Long term (current) use of antibiotics: Secondary | ICD-10-CM | POA: Diagnosis not present

## 2014-09-04 DIAGNOSIS — R109 Unspecified abdominal pain: Secondary | ICD-10-CM | POA: Diagnosis present

## 2014-09-04 HISTORY — DX: Complete or unspecified spontaneous abortion without complication: O03.9

## 2014-09-04 LAB — URINE MICROSCOPIC-ADD ON

## 2014-09-04 LAB — CBC WITH DIFFERENTIAL/PLATELET
BASOS PCT: 0 % (ref 0–1)
Basophils Absolute: 0 10*3/uL (ref 0.0–0.1)
Eosinophils Absolute: 0 10*3/uL (ref 0.0–0.7)
Eosinophils Relative: 0 % (ref 0–5)
HCT: 33.5 % — ABNORMAL LOW (ref 36.0–46.0)
Hemoglobin: 10.9 g/dL — ABNORMAL LOW (ref 12.0–15.0)
LYMPHS ABS: 1.2 10*3/uL (ref 0.7–4.0)
Lymphocytes Relative: 13 % (ref 12–46)
MCH: 28.6 pg (ref 26.0–34.0)
MCHC: 32.5 g/dL (ref 30.0–36.0)
MCV: 87.9 fL (ref 78.0–100.0)
Monocytes Absolute: 0.7 10*3/uL (ref 0.1–1.0)
Monocytes Relative: 8 % (ref 3–12)
Neutro Abs: 7.4 10*3/uL (ref 1.7–7.7)
Neutrophils Relative %: 79 % — ABNORMAL HIGH (ref 43–77)
PLATELETS: 187 10*3/uL (ref 150–400)
RBC: 3.81 MIL/uL — ABNORMAL LOW (ref 3.87–5.11)
RDW: 14.3 % (ref 11.5–15.5)
WBC: 9.4 10*3/uL (ref 4.0–10.5)

## 2014-09-04 LAB — URINALYSIS, ROUTINE W REFLEX MICROSCOPIC
Bilirubin Urine: NEGATIVE
GLUCOSE, UA: NEGATIVE mg/dL
KETONES UR: NEGATIVE mg/dL
LEUKOCYTES UA: NEGATIVE
Nitrite: NEGATIVE
PH: 6 (ref 5.0–8.0)
PROTEIN: 30 mg/dL — AB
Specific Gravity, Urine: 1.024 (ref 1.005–1.030)
Urobilinogen, UA: 1 mg/dL (ref 0.0–1.0)

## 2014-09-04 LAB — BASIC METABOLIC PANEL
Anion gap: 5 (ref 5–15)
BUN: 10 mg/dL (ref 6–23)
CO2: 25 mmol/L (ref 19–32)
Calcium: 8.2 mg/dL — ABNORMAL LOW (ref 8.4–10.5)
Chloride: 105 mEq/L (ref 96–112)
Creatinine, Ser: 0.63 mg/dL (ref 0.50–1.10)
GFR calc non Af Amer: >90 mL/min (ref >90)
Glucose, Bld: 97 mg/dL (ref 70–99)
Potassium: 3.2 mmol/L — ABNORMAL LOW (ref 3.5–5.1)
SODIUM: 135 mmol/L (ref 135–145)

## 2014-09-04 MED ORDER — POTASSIUM CHLORIDE CRYS ER 20 MEQ PO TBCR
40.0000 meq | EXTENDED_RELEASE_TABLET | Freq: Once | ORAL | Status: AC
  Administered 2014-09-04: 40 meq via ORAL
  Filled 2014-09-04: qty 2

## 2014-09-04 MED ORDER — AMOXICILLIN-POT CLAVULANATE 875-125 MG PO TABS
1.0000 | ORAL_TABLET | Freq: Once | ORAL | Status: AC
  Administered 2014-09-04: 1 via ORAL
  Filled 2014-09-04: qty 1

## 2014-09-04 NOTE — ED Notes (Signed)
2 hx of r/flank pain increasing over last 24 hrs. Pt is [redacted] weeks pregnant. Seen by Dr. Carren Rang, [facility] 1300 today. Pt referred to [facility] to kidney stones. Pt reports occasional nausea. Denies vomiting. Denies bleeding

## 2014-09-04 NOTE — ED Provider Notes (Signed)
CSN: [id number]     Arrival date & time 09/04/14  1339 History  This chart was scribed for Monico Blitz, PA-C, working with Ernestina Patches, MD by Marti Sleigh, ED Scribe. This patient was seen in room [facility] and the patient's care was started at 2:34 PM.    Chief Complaint  Patient presents with  . Flank Pain    r/side pain x 2 days. Seen by gyn today    HPI  HPI Comments: Brooke [name] is a 39 y.o. female G4 P1 [redacted] weeks pregnant who presents to the Emergency Department complaining of constant worsening, nonradiating right-sided mid abdominal pain that started three days ago. Pt has a PMHx of 2x miscarriage. Pt states she was awakened from sleep at 2 AM x2days by the pain, and it has consistently worsened since then. PT states the pain seems to radiate to her right lower back, but she is not sure. Pt states her pain is 10/10 at worst, 8/10 now, worse than childbirth. Pt states she went to the ER yesterday and was diagnosed with suspected diverticulitis. Pt states her white count was normal at that time. Pt reported to her OBGYN this morning and had an OB US and eval. which was normal. OB/GYN suggested that she comes to me for further workup of nephrolithiasis. Pt denies fever, chills, abdominal discharge, or abdominal bleeding, nausea, vomiting, chest pain, shortness of breath. She was prescribed Augmentin and oxycodone at the ED last night. She has missed a single dose of Augmentin today. Pt states she has taken one oxycodone PTA today. Pt states she took a tylenol last night due to pain.  Past Medical History  Diagnosis Date  . Generalized anxiety disorder   . Esophageal reflux   . Unspecified sinusitis (chronic)   . Allergic rhinitis   . PONV (postoperative nausea and vomiting)     phenergan in iv before dc  . Miscarriage    Past Surgical History  Procedure Laterality Date  . Cystectomy      left wrist  . Dilation and evacuation N/A 04/24/2014    Procedure: DILATATION AND EVACUATION  WITH ULTRASOUND GUIDANCE ;  Surgeon: Marylynn Pearson, MD;  Location: [facility];  Service: Gynecology;  Laterality: N/A;   Family History  Problem Relation Age of Onset  . Diabetes Father   . Hypertension Father   . Hypertension Mother   . Diverticulosis Mother   . Prostate cancer Paternal Uncle   . Leukemia Paternal Uncle   . Hypertension Other     sibling   History  Substance Use Topics  . Smoking status: Never Smoker   . Smokeless tobacco: Never Used  . Alcohol Use: No   OB History    Gravida Para Term Preterm AB TAB SAB Ectopic Multiple Living   4         1     Review of Systems  A complete 10 system review of systems was obtained and all systems are negative except as noted in the HPI and PMH.    Allergies  Macrobid and Prednisone  Home Medications   Prior to Admission medications   Medication Sig Start Date End Date Taking? Authorizing Provider  Amoxicillin-Pot Clavulanate (AUGMENTIN PO) Take 1 tablet by mouth 3 (three) times daily.   Yes Historical Provider, MD  ondansetron (ZOFRAN) 4 MG tablet Take 4 mg by mouth every 8 (eight) hours as needed for nausea or vomiting.   Yes Historical Provider, MD  oxycodone (OXY-IR) 5 MG capsule Take  5 mg by mouth every 4 (four) hours as needed for pain.   Yes Historical Provider, MD  Prenatal Vit-Fe Fumarate-FA (PRENATAL MULTIVITAMIN) TABS tablet Take 1 tablet by mouth daily.    Yes Historical Provider, MD  progesterone 200 MG SUPP Place 200 mg vaginally 2 (two) times daily.   Yes Historical Provider, MD  RABEprazole (ACIPHEX) 20 MG tablet Take 20 mg by mouth daily.   Yes Historical Provider, MD   BP 111/72 mmHg  Pulse 85  Temp(Src) 98.1 F (36.7 C) (Oral)  Resp 20  Wt 145 lb (65.772 kg)  SpO2 100%  LMP 06/23/2014 (Exact Date) Physical Exam  Constitutional: She is oriented to person, place, and time. She appears well-developed and well-nourished.  HENT:  Head: Normocephalic and atraumatic.  Eyes: Pupils are equal, round,  and reactive to light.  Neck: Neck supple.  Cardiovascular: Normal rate and regular rhythm.   Pulmonary/Chest: Effort normal and breath sounds normal. No respiratory distress.  Abdominal: Soft. She exhibits no distension and no mass. There is tenderness. There is no rebound and no guarding.    Hyperactive bowel sounds  Genitourinary:  No CVA tenderness to palpation bilaterally  Neurological: She is alert and oriented to person, place, and time.  Skin: Skin is warm and dry.  Psychiatric: She has a normal mood and affect. Her behavior is normal.  Nursing note and vitals reviewed.   ED Course  Procedures  DIAGNOSTIC STUDIES: Oxygen Saturation is 100% on RA, normal by my interpretation.    COORDINATION OF CARE: 2:39 PM Discussed treatment plan with pt at bedside and pt agreed to plan.  Labs Review Labs Reviewed  URINE CULTURE  URINALYSIS, ROUTINE W REFLEX MICROSCOPIC  CBC WITH DIFFERENTIAL  BASIC METABOLIC PANEL    Imaging Review No results found.   EKG Interpretation None      MDM   Final diagnoses:  Flank pain    Filed Vitals:   09/04/14 1353  BP: 111/72  Pulse: 85  Temp: 98.1 F (36.7 C)  TempSrc: Oral  Resp: 20  Weight: 145 lb (65.772 kg)  SpO2: 100%    Medications  amoxicillin-clavulanate (AUGMENTIN) 875-125 MG per tablet 1 tablet (1 tablet Oral Given 09/04/14 1651)    Brooke [name] is a pleasant 39 y.o. female presenting with right mid abdominal pain onset 2 days ago. Patient was seen and evaluated at [facility] ED last night, she had a MRI which showed scattered diverticuli along the length of the colon including the proximal transverse colon with edema and inflammation of the pericolonic fat around the transverse colon. Her appendix was visualized with no abnormalities. There is intrauterine gestational sac. Patient was seen and evaluated by her OB/GYN this morning ultrasound and cleared her from this perspective. Her OB/GYN and was concerned about  kidney stones. There were no kidney stones visualized on MRI. Ultrasound obtained today with no hydronephrosis. Urinalysis with no significant hemoglobin. I doubt this is kidney stones. I discussed this with the attending physician who agrees with plan to continue treatment for diverticulosis. Patient has Augmentin at home, she will be given her missed dose here in the ED. Patient also has a mildly decreased potassium which will be repleted orally. Patient has oxycodone at home for pain relief.   Discussed case with attending MD who agrees with plan and stability to d/c to home.   Evaluation does not show pathology that would require ongoing emergent intervention or inpatient treatment. Pt is hemodynamically stable and mentating appropriately. Discussed  findings and plan with patient/guardian, who agrees with care plan. All questions answered. Return precautions discussed and outpatient follow up given.   I personally performed the services described in this documentation, which was scribed in my presence. The recorded information has been reviewed and is accurate.    Monico Blitz, PA-C 09/04/14 [place], PA-C 09/04/14 1745  Brooke Hinde, PA-C 09/04/14 1748  Tanna Furry, MD 09/15/14 [phone]

## 2014-09-04 NOTE — ED Notes (Signed)
Ultrasound at bedside

## 2014-09-04 NOTE — ED Notes (Signed)
Bed: WA08 Expected date:  Expected time:  Means of arrival:  Comments: Hold for TR9

## 2014-09-04 NOTE — Discharge Instructions (Signed)
Please follow with your primary care doctor in the next 2 days for a check-up. They must obtain records for further management.  ° °Do not hesitate to return to the Emergency Department for any new, worsening or concerning symptoms.  ° °

## 2014-09-05 LAB — URINE CULTURE
CULTURE: NO GROWTH
Colony Count: NO GROWTH

## 2014-09-29 HISTORY — PX: COLONOSCOPY: SHX174

## 2014-12-17 ENCOUNTER — Other Ambulatory Visit: Payer: Self-pay | Admitting: Gynecology

## 2014-12-17 DIAGNOSIS — R928 Other abnormal and inconclusive findings on diagnostic imaging of breast: Secondary | ICD-10-CM

## 2014-12-31 ENCOUNTER — Ambulatory Visit
Admission: RE | Admit: 2014-12-31 | Discharge: 2014-12-31 | Disposition: A | Discharge status: Home / Self Care | Payer: BC Managed Care – PPO | Source: Ambulatory Visit | Attending: Gynecology | Admitting: Gynecology

## 2014-12-31 DIAGNOSIS — R928 Other abnormal and inconclusive findings on diagnostic imaging of breast: Secondary | ICD-10-CM

## 2015-02-16 ENCOUNTER — Other Ambulatory Visit: Payer: Self-pay | Admitting: Gynecology

## 2015-02-17 LAB — CYTOLOGY - PAP

## 2015-06-29 ENCOUNTER — Encounter (HOSPITAL_COMMUNITY): Payer: Self-pay | Admitting: *Deleted

## 2016-03-20 IMAGING — MG MM DIAG BREAST TOMO UNI LEFT
6 series · 6 of 18 positions shown · non-contrast
Comparison: Baseline screening mammogram at [REDACTED] dated 12/15/2014.

CLINICAL DATA: Possible asymmetry in the central left breast and in
the upper left breast at recent screening mammography.

EXAM:
DIGITAL DIAGNOSTIC LEFT MAMMOGRAM WITH 3D TOMOSYNTHESIS AND CAD

[L MLO (1 of 2)]
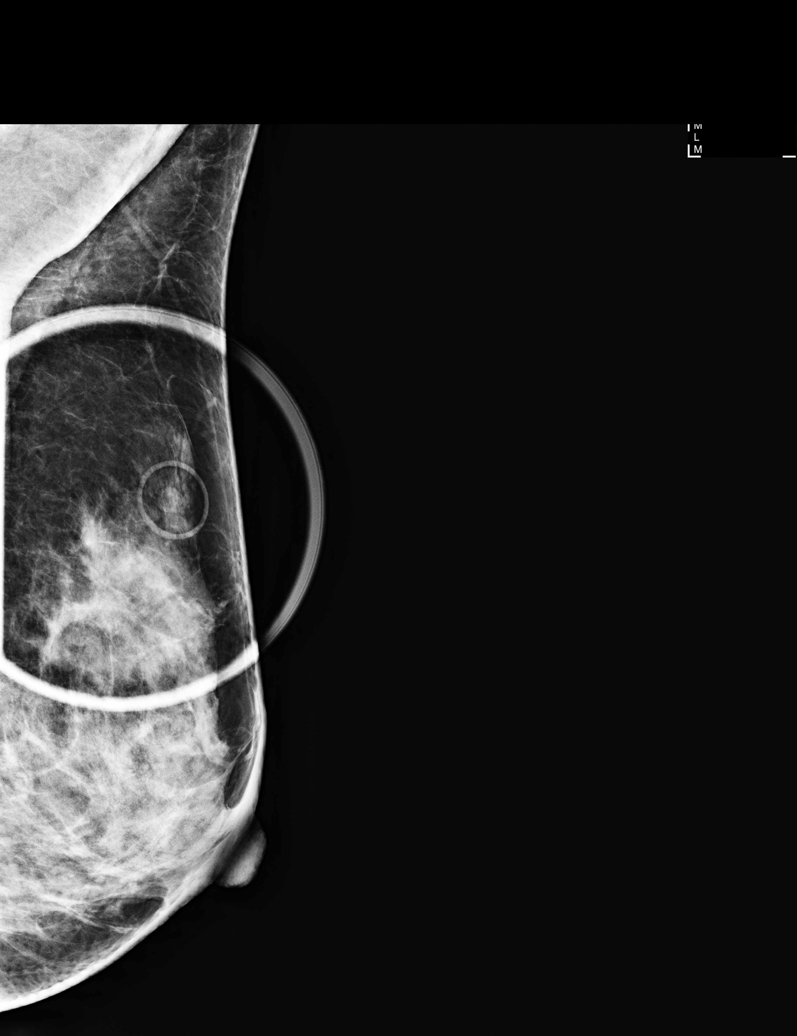

[L ML]
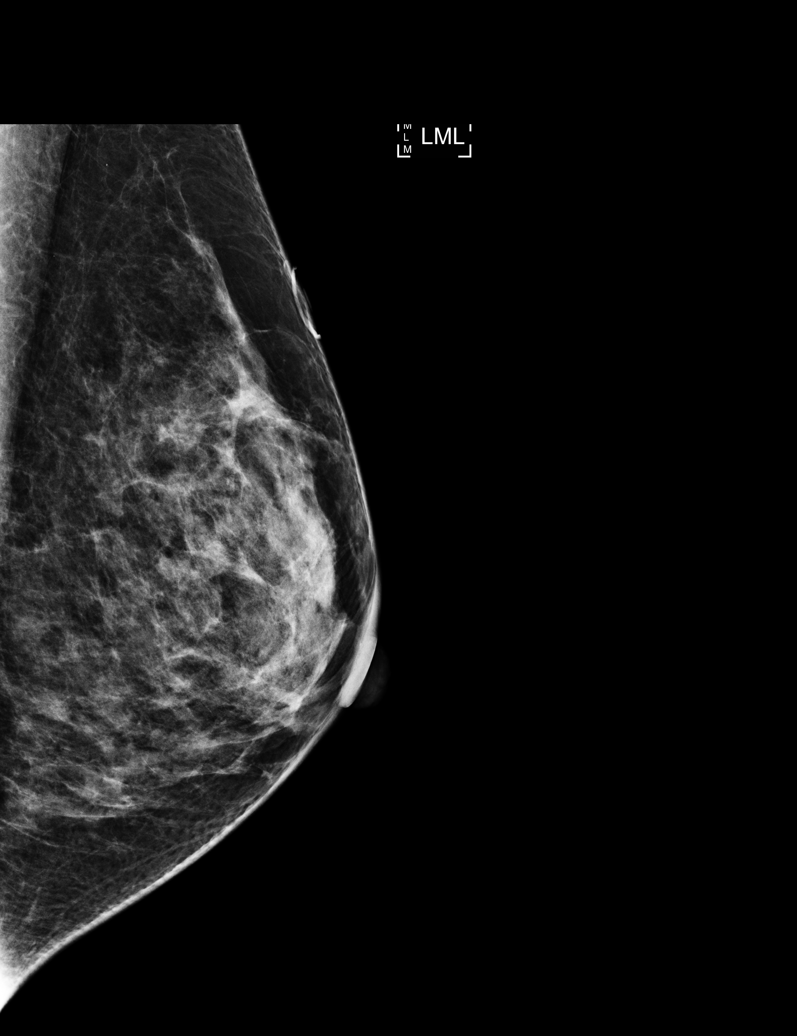

[L MLO (2 of 2)]
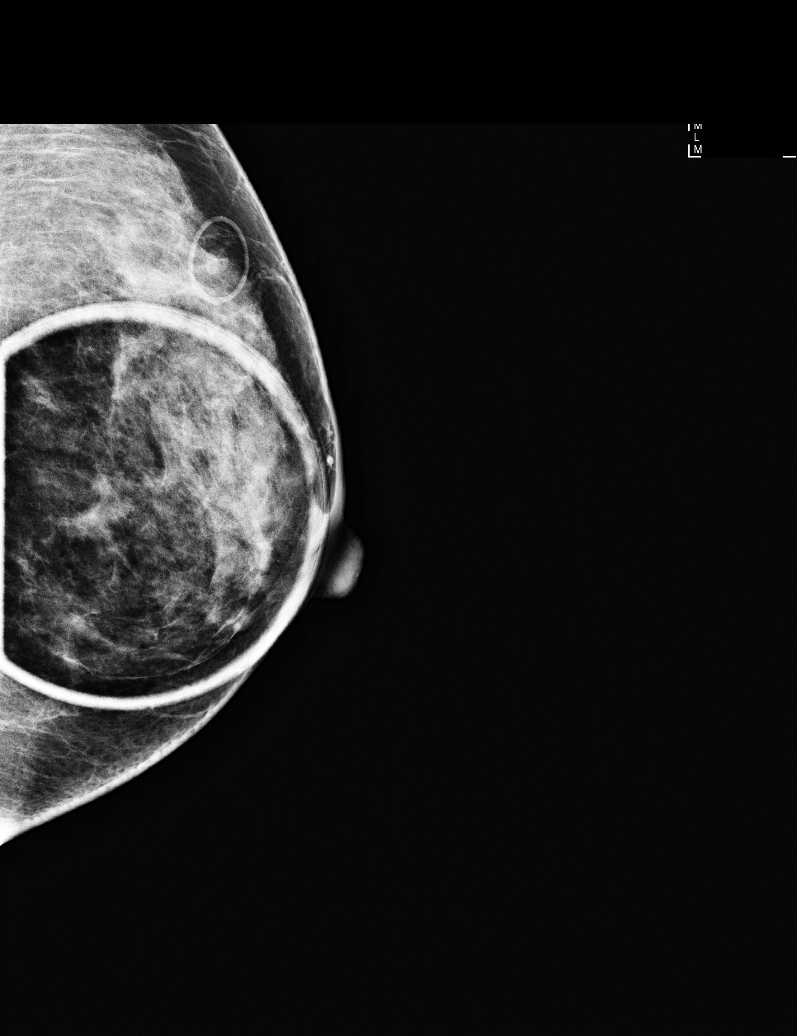

[L ML tomo · tomo slice 25/48.0]
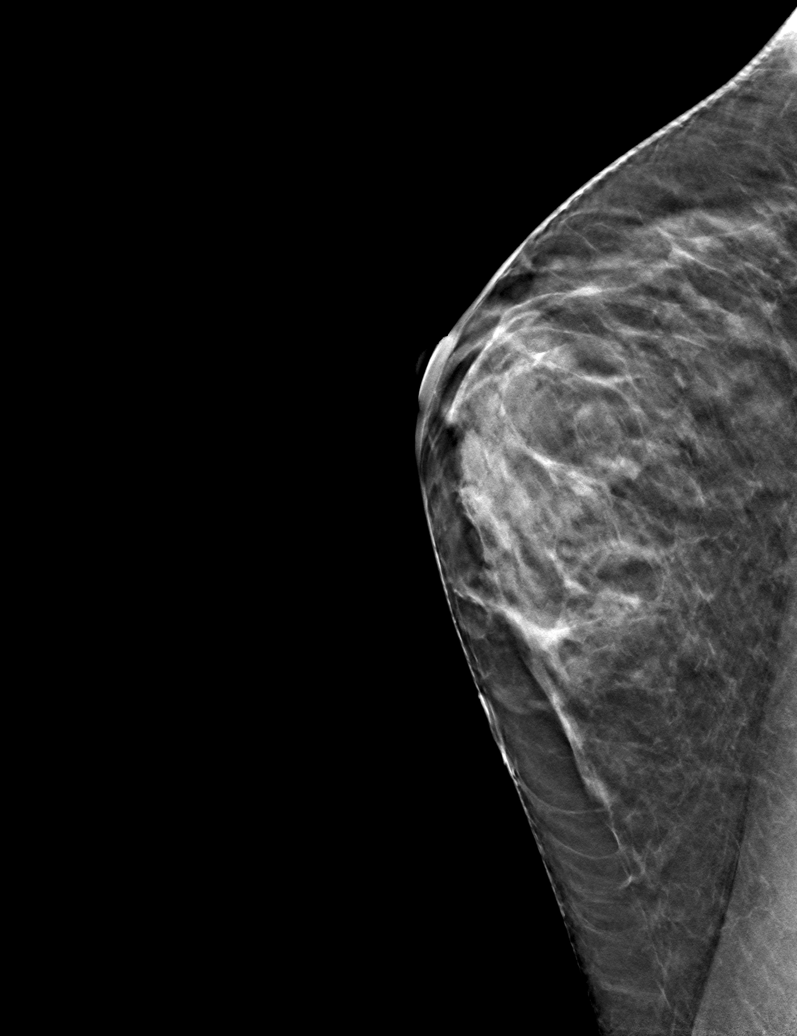

[L MLO tomo (1 of 2) · tomo slice 23/45.0]
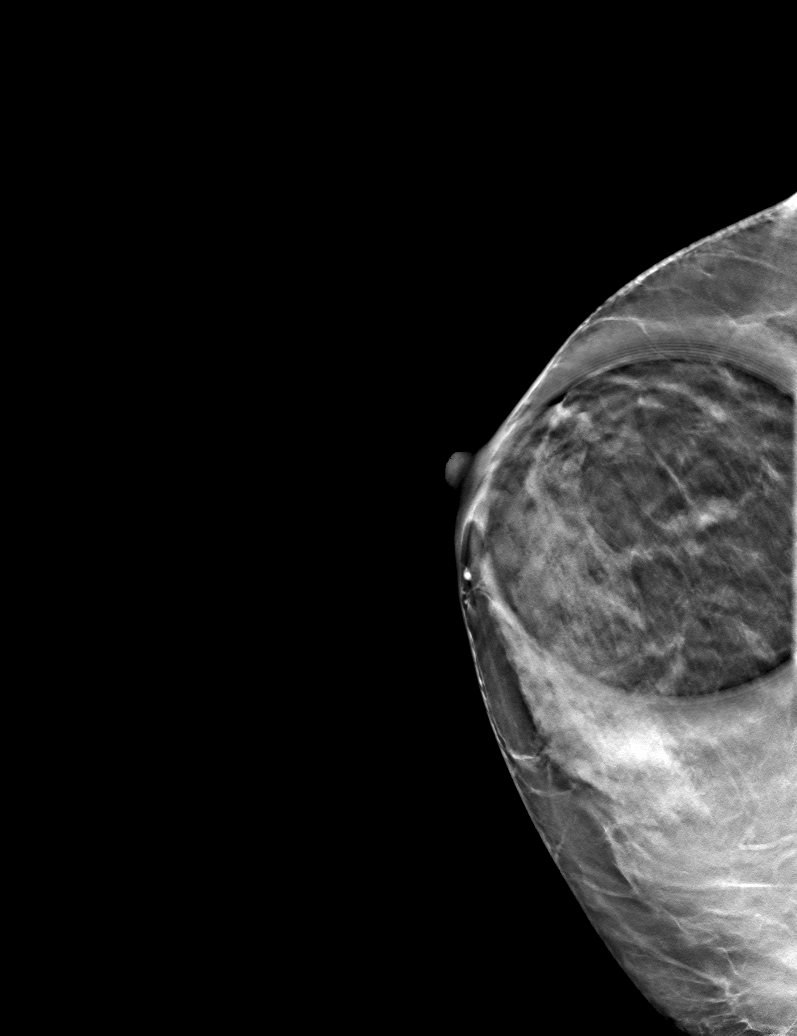

[L MLO tomo (2 of 2) · tomo slice 22/43.0]
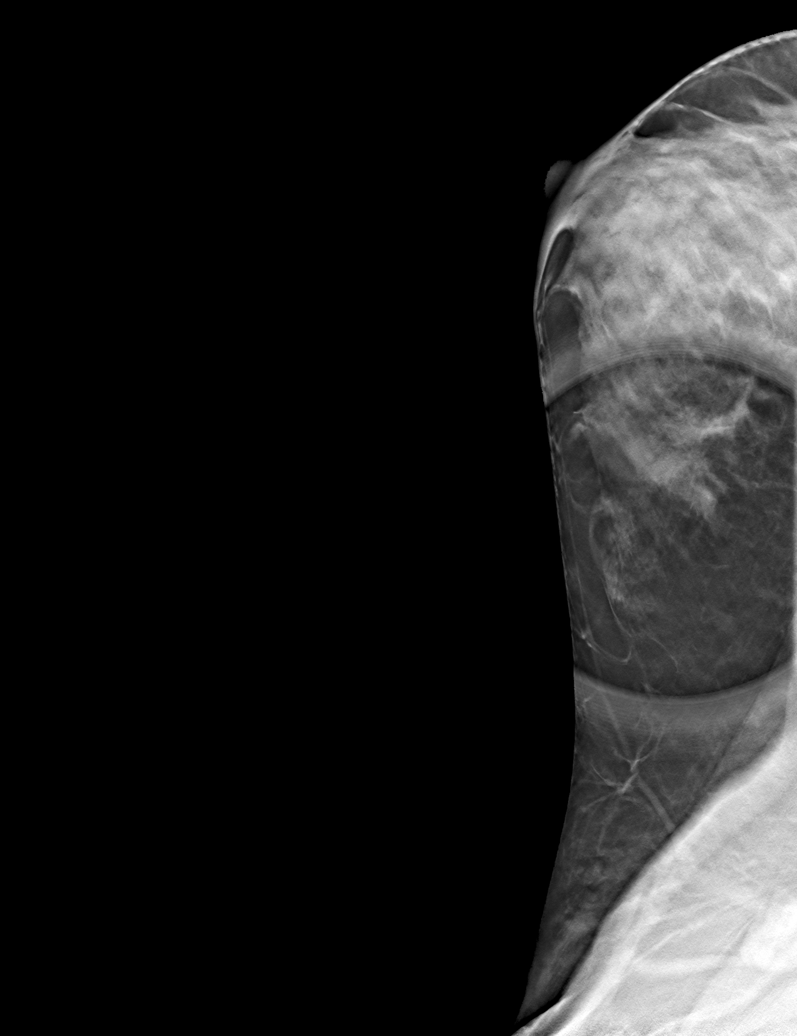

[6 of 18 positions shown; findings below may reference images not displayed]

ACR Breast Density Category c: The breast tissue is heterogeneously
dense, which may obscure small masses.
FINDINGS: True lateral and spot compression oblique 3D images of the left
breast demonstrate normal appearing fibroglandular tissue at the
location of the recently suspected asymmetry in the central portion
of the breast. There is also a skin mole corresponding to the
recently suspected asymmetry in the upper left breast.

Mammographic images were processed with CAD.
IMPRESSION: No evidence of malignancy. There is a skin mole in the upper left
breast and there is normal fibroglandular tissue corresponding to
the recently suspected asymmetry in the central left breast.

RECOMMENDATION:
Bilateral screening mammogram in 1 year.

I have discussed the findings and recommendations with the patient.
Results were also provided in writing at the conclusion of the
visit. If applicable, a reminder letter will be sent to the patient
regarding the next appointment.

BI-RADS CATEGORY  2: Benign.

## 2016-04-10 IMAGING — US US OB TRANSVAGINAL
1 series · 13 of 28 positions shown · non-contrast
Comparison: None.

CLINICAL DATA: Abdominal and back pain. Quantitative beta HCG
pending. Estimated gestational age 8 weeks 6 days per LMP. History
of spontaneous abortion twice in 1442.

EXAM:
OBSTETRIC <14 WK US AND TRANSVAGINAL OB US
TECHNIQUE: Both transabdominal and transvaginal ultrasound examinations were
performed for complete evaluation of the gestation as well as the
maternal uterus, adnexal regions, and pelvic cul-de-sac.
Transvaginal technique was performed to assess early pregnancy.

[Series 1: us ob comp less 14 wks · 13 of 45 slices shown]
[im 2/45]
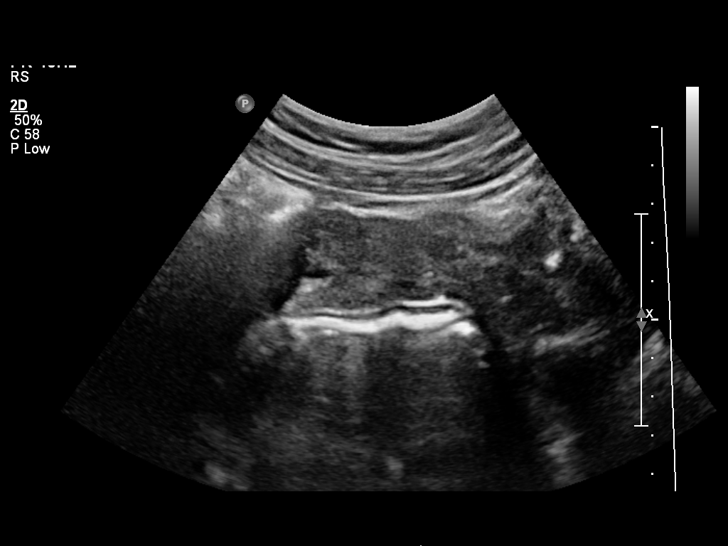
[im 5/45]
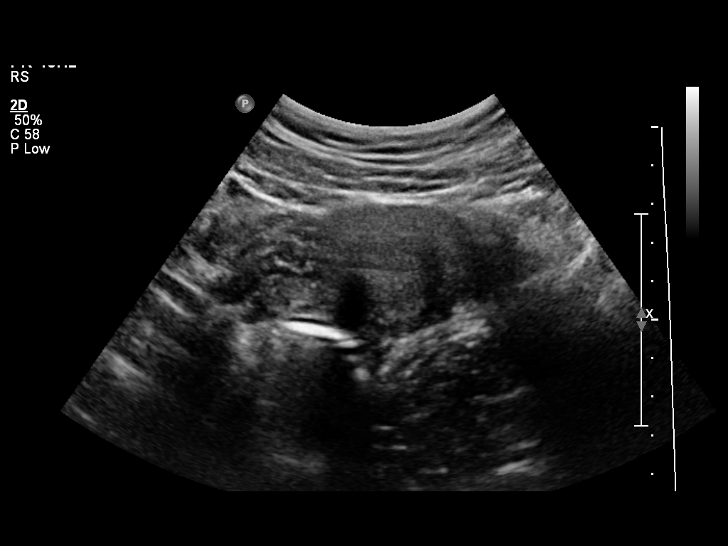
[im 9/45]
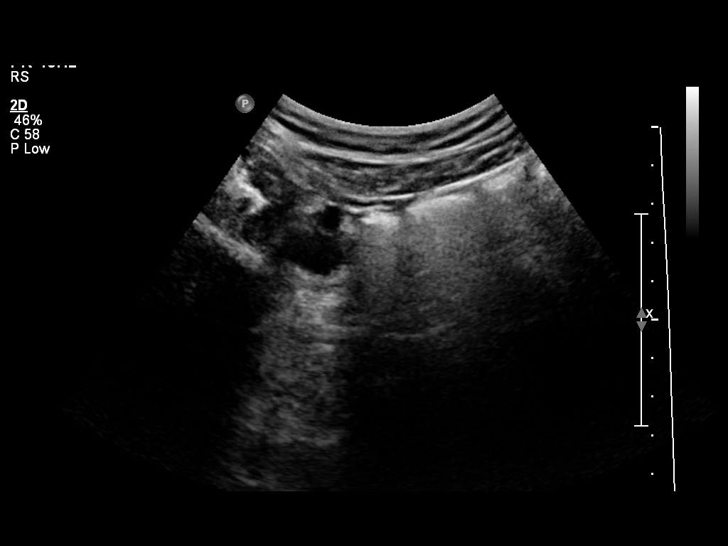
[im 12/45]
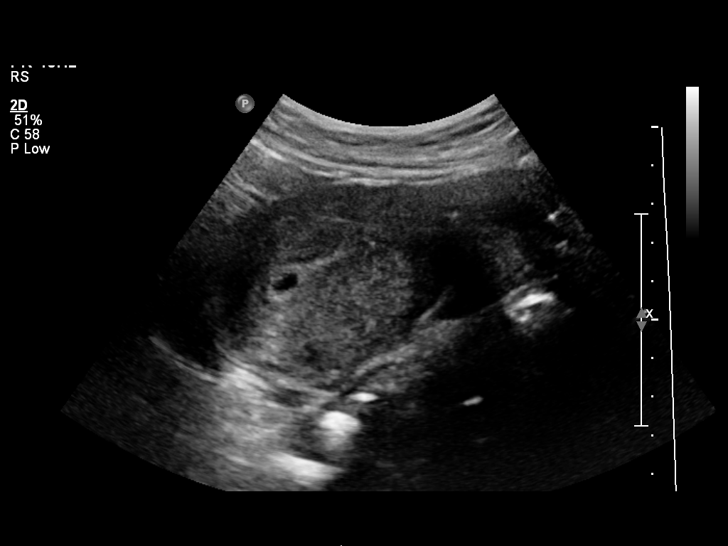
[im 15/45]
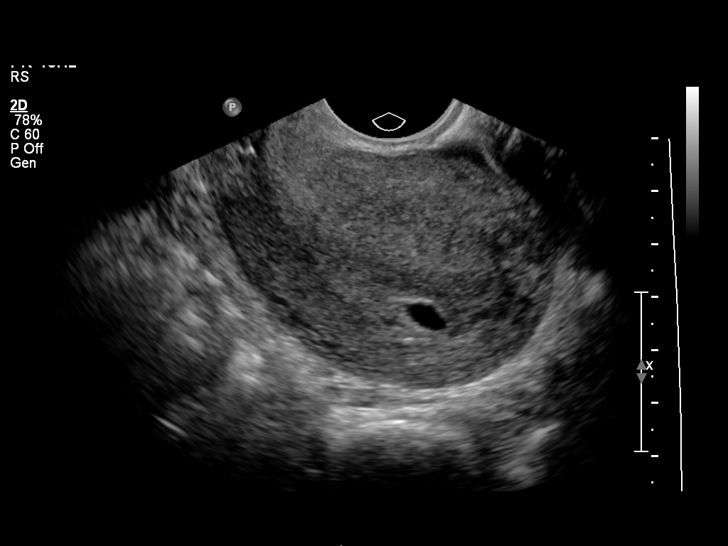
[im 18/45]
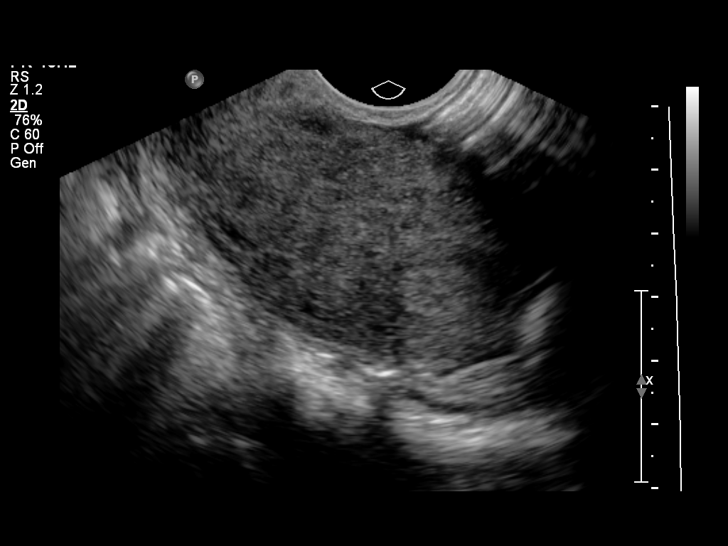
[im 23/45]
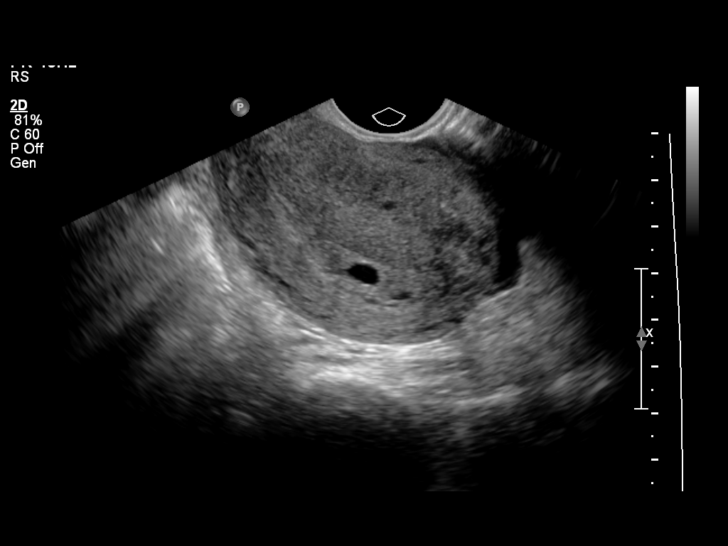
[im 27/45]
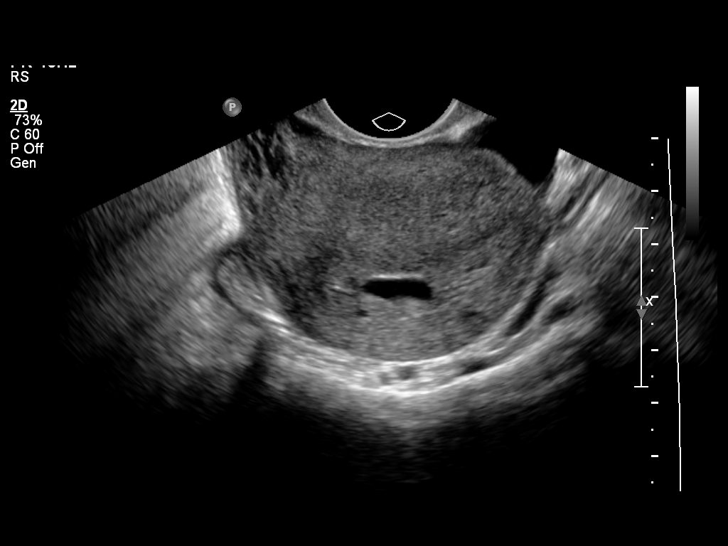
[im 30/45]
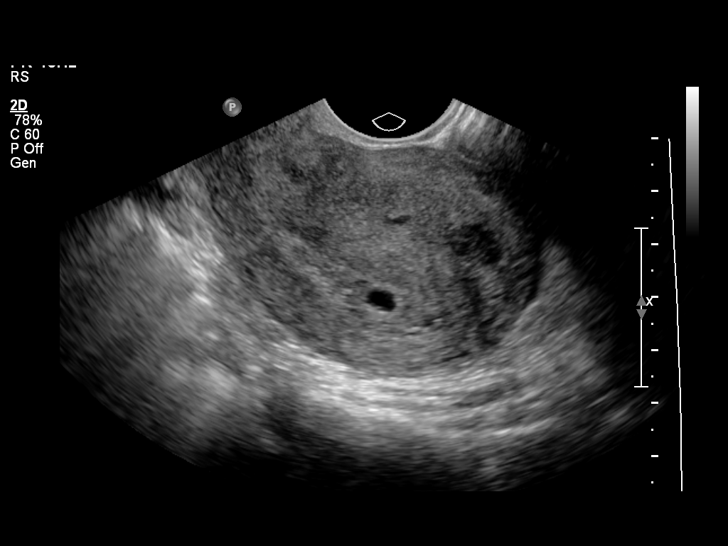
[im 33/45]
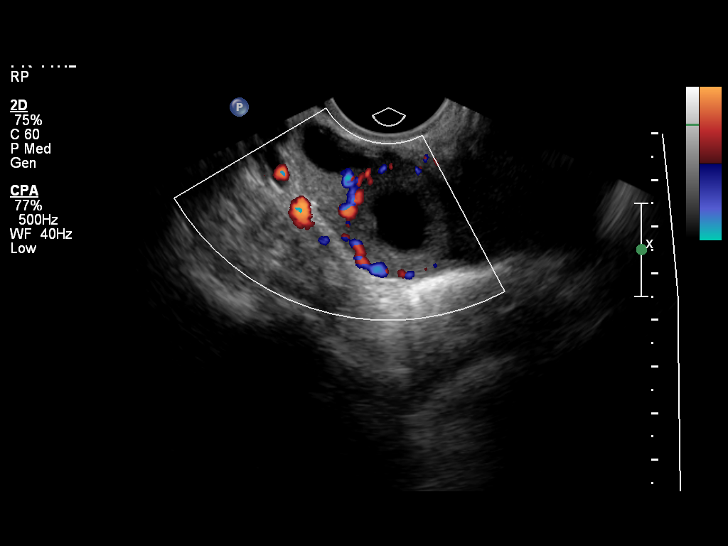
[im 36/45]
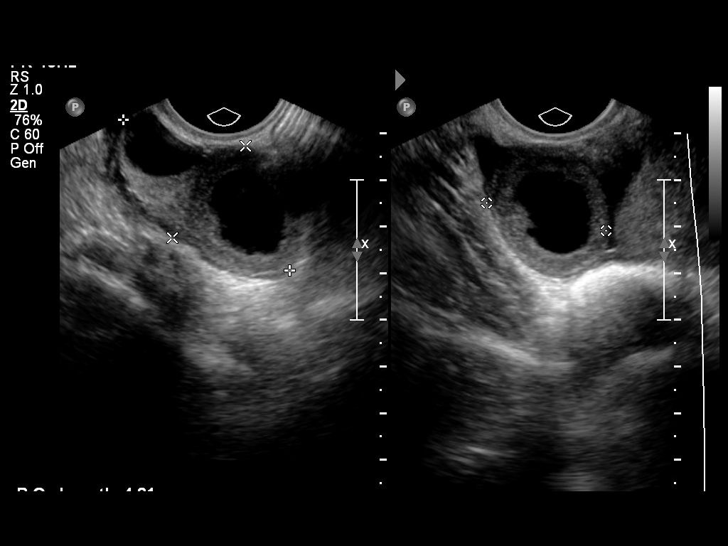
[im 40/45]
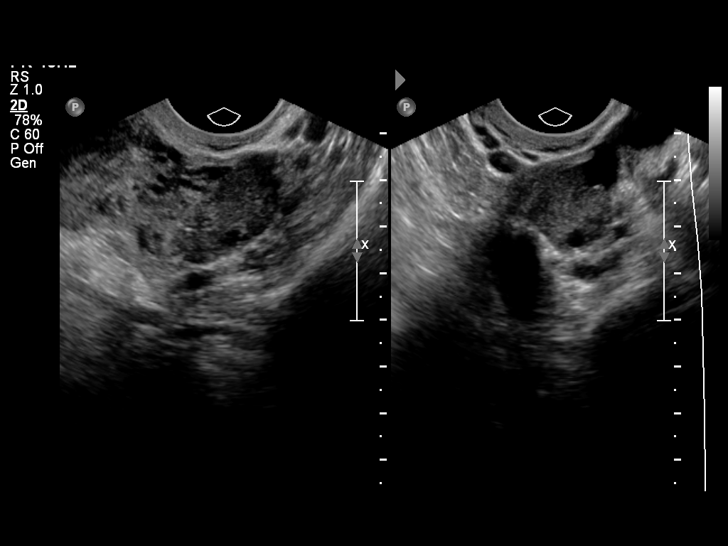
[im 43/45]
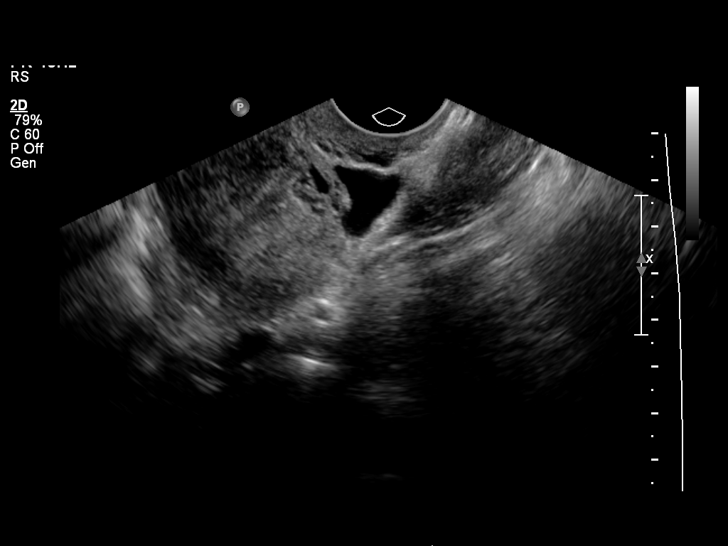

[13 of 28 positions shown; findings below may reference images not displayed]

FINDINGS: Intrauterine gestational sac: Somewhat elongated gestational sac
without decidual reaction. Adjacent clustered cystic change over the
endometrium which may represent day subchorionic hemorrhage and
measures 1.2 cm in greatest diameter.

Yolk sac:  Not visualized.

Embryo:  Not visualized.

Cardiac Activity: Not visualized.

Heart Rate:  Not visualized.

MSD:  9.4  mm   5 w   4 d     US EDC: 04/22/2015

Maternal uterus/adnexae: 1.5 cm fibroid over the fundus. Right
ovarian corpus luteal cyst present. Ovaries although otherwise
unremarkable. No free pelvic fluid.
IMPRESSION: Findings suspicious for failed pregnancy, although cannot exclude
normal early pregnancy. Recommend correlation with serial followup
quantitative beta HCG and ultrasound as indicated.

## 2019-10-15 ENCOUNTER — Encounter: Payer: Self-pay | Admitting: Gastroenterology

## 2019-10-28 ENCOUNTER — Encounter: Payer: Self-pay | Admitting: Gastroenterology

## 2019-10-28 ENCOUNTER — Other Ambulatory Visit: Payer: Self-pay

## 2019-10-28 ENCOUNTER — Ambulatory Visit: Payer: BC Managed Care – PPO | Admitting: Gastroenterology

## 2019-10-28 VITALS — BP 116/76 | HR 76 | Temp 98.0°F | Ht 63.5 in | Wt 158.5 lb

## 2019-10-28 DIAGNOSIS — K581 Irritable bowel syndrome with constipation: Secondary | ICD-10-CM | POA: Diagnosis not present

## 2019-10-28 DIAGNOSIS — K219 Gastro-esophageal reflux disease without esophagitis: Secondary | ICD-10-CM

## 2019-10-28 MED ORDER — RABEPRAZOLE SODIUM 20 MG PO TBEC: 20.0000 mg | DELAYED_RELEASE_TABLET | Freq: Every day | ORAL | 11 refills | Status: DC

## 2019-10-28 NOTE — Progress Notes (Signed)
Chief Complaint: To get established  Referring Provider:  Ernestene Kiel, MD      ASSESSMENT AND PLAN;   #1. GERD  #2. H/O diverticulitis 2016, treated with Augmentin.  Neg colon 2016 @ [facility] (records awaited)  #3. IBS-C  Plan: -Aciphex 20mg  po qd, 30, 11 refills. -Benefiber 1TBS p.o. once a day with 8 ounces of water. -Increase water intake. -FU in 6 months -Please obtain previous records. -Encouraged her to start walking 30 min/day and try to lose weight. -If any problems with esophageal spasms/abdominal pain, restart Levsin S/L as needed. -Rpt Colon at age 60.   HPI:    Brooke [name] is a 44 y.o. female  Here to get established Has been having occasional heartburn, better with AcipHex.  Occasional esophageal spasms as before.  Also diagnosed with IBS-C, with pellet-like stools occasionally.  Better with increased fiber intake.  Occasional abdominal bloating and lower abdominal discomfort which does get better with defecation.  No nausea, vomiting, heartburn, regurgitation, odynophagia or dysphagia.  No significant diarrhea. No melena or hematochezia. No unintentional weight loss. No abdominal pain.  Had RLQ pain January 2016 - Dx with Ac diverticulitis - had ?CT @ [facility] (not in Epic)- diverticultis, treated with cipro/flagyl then augementin.  Then underwent colon March/April 2016- neg for polyps.  Told to repeat in 10 years.  Records are awaited.  Has gained weight as detailed below. Wt Readings from Last 3 Encounters:  10/28/19 158 lb 8 oz (71.9 kg)  09/04/14 145 lb (65.8 kg)  04/24/14 147 lb (66.7 kg)   Previous GI procedures: EGD 07/2010: Mild gastritis. Neg SB bx and CLO. [place] 08/2014: Normal kidneys. 2.3 cm hepatic hemangioma. HIDA 08/2010 with EF- Nl.  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  Mom is Brigitte Pulse.  Past Medical History:  Diagnosis Date  . Allergic rhinitis   . Anxiety   . Diverticulosis     . Esophageal reflux   . Generalized anxiety disorder   . IBS (irritable bowel syndrome)   . Miscarriage   . PONV (postoperative nausea and vomiting)    phenergan in iv before dc  . Unspecified sinusitis (chronic)   . UTI (urinary tract infection)     Past Surgical History:  Procedure Laterality Date  . COLONOSCOPY  09/2014   [facility] after having problems with divertulosis  . CYSTECTOMY     left wrist  . DILATION AND EVACUATION N/A 04/24/2014   Procedure: DILATATION AND EVACUATION WITH ULTRASOUND GUIDANCE ;  Surgeon: Marylynn Pearson, MD;  Location: [facility];  Service: Gynecology;  Laterality: N/A;  . ESOPHAGOGASTRODUODENOSCOPY      Family History  Problem Relation Age of Onset  . Diabetes Father   . Hypertension Father   . Hypertension Mother   . Diverticulosis Mother   . Prostate cancer Paternal Uncle   . Leukemia Paternal Uncle   . Hypertension Other        sibling  . Colon polyps Maternal Grandfather   . Colon cancer Neg Hx   . Esophageal cancer Neg Hx     Social History   Tobacco Use  . Smoking status: Never Smoker  . Smokeless tobacco: Never Used  Substance Use Topics  . Alcohol use: Yes    Comment: rarely  . Drug use: No    Current Outpatient Medications  Medication Sig Dispense Refill  . amoxicillin-clavulanate (AUGMENTIN) 500-125 MG tablet 1 tablet 3 (three) times daily.    Marland Kitchen  Prenatal Vit-Fe Fumarate-FA (PRENATAL MULTIVITAMIN) TABS tablet Take 1 tablet by mouth daily.     . RABEprazole (ACIPHEX) 20 MG tablet Take 20 mg by mouth daily.     No current facility-administered medications for this visit.    Allergies  Allergen Reactions  . Macrobid [Nitrofurantoin Monohyd Macro] Shortness Of Breath    Chest tightness  . Prednisone Other (See Comments)    Pt states that her heart races, she is unable to sleep, and it makes her "feel crazy".     Review of Systems:  Constitutional: Denies fever, chills, diaphoresis, appetite change and fatigue.   HEENT: Denies photophobia, eye pain, redness, hearing loss.  Has allergies/sinus problems.  Occasional night sweats. Respiratory: Denies SOB, DOE, cough, chest tightness,  and wheezing.   Cardiovascular: Denies chest pain, palpitations and leg swelling.  Genitourinary: Denies dysuria, urgency, frequency, hematuria, flank pain and difficulty urinating.  Has UTI. Musculoskeletal: Denies myalgias, back pain, joint swelling, arthralgias and gait problem.  Skin: No rash.  Neurological: Denies dizziness, seizures, syncope, weakness, light-headedness, numbness and headaches.  Hematological: Denies adenopathy. Easy bruising, personal or family bleeding history  Psychiatric/Behavioral: No anxiety or depression     Physical Exam:    BP 116/76   Pulse 76   Temp 98 F (36.7 C)   Ht 5' 3.5" (1.613 m)   Wt 158 lb 8 oz (71.9 kg)   BMI 27.64 kg/m  Wt Readings from Last 3 Encounters:  10/28/19 158 lb 8 oz (71.9 kg)  09/04/14 145 lb (65.8 kg)  04/24/14 147 lb (66.7 kg)   Constitutional:  Well-developed, in no acute distress. Psychiatric: Normal mood and affect. Behavior is normal. HEENT: Pupils normal.  Conjunctivae are normal. No scleral icterus. Neck supple.  Cardiovascular: Normal rate, regular rhythm. No edema Pulmonary/chest: Effort normal and breath sounds normal. No wheezing, rales or rhonchi. Abdominal: Soft, nondistended. Nontender. Bowel sounds active throughout. There are no masses palpable. No hepatomegaly. Rectal:  defered Neurological: Alert and oriented to person place and time. Skin: Skin is warm and dry. No rashes noted.    Carmell Austria, MD 10/28/2019, 10:04 AM  Cc: Ernestene Kiel, MD

## 2019-10-28 NOTE — Patient Instructions (Addendum)
If you are age 46 or older, your body mass index should be between 23-30. Your Body mass index is 27.64 kg/m. If this is out of the aforementioned range listed, please consider follow up with your Primary Care Provider.  If you are age 24 or younger, your body mass index should be between 19-25. Your Body mass index is 27.64 kg/m. If this is out of the aformentioned range listed, please consider follow up with your Primary Care Provider.   We have sent the following medications to your pharmacy for you to pick up at your convenience: Aciphex   Please purchase the following medications over the counter and take as directed: Benefiber 1 tablespoon once daily  Increase water intake.   Follow up in 6 months.   Thank you,  Dr. Jackquline Denmark

## 2019-10-29 ENCOUNTER — Telehealth: Payer: Self-pay | Admitting: Gastroenterology

## 2019-10-29 DIAGNOSIS — Z86010 Personal history of colonic polyps: Secondary | ICD-10-CM

## 2019-10-29 DIAGNOSIS — Z1211 Encounter for screening for malignant neoplasm of colon: Secondary | ICD-10-CM

## 2019-10-29 NOTE — Telephone Encounter (Signed)
Per Dr. Lyndel Safe after reviewing last colonoscopy report after patients appointment yesterday, patient is due for a colonoscopy with Miralax prep. Patient has been scheduled for 12/25/19 at 9:30am. Instructions have been mailed to patient.

## 2019-12-11 ENCOUNTER — Encounter: Payer: Self-pay | Admitting: Gastroenterology

## 2019-12-25 ENCOUNTER — Encounter: Payer: Self-pay | Admitting: Gastroenterology

## 2019-12-25 ENCOUNTER — Other Ambulatory Visit: Payer: Self-pay

## 2019-12-25 ENCOUNTER — Ambulatory Visit: Payer: BC Managed Care – PPO | Admitting: Gastroenterology

## 2019-12-25 VITALS — BP 105/65 | HR 64 | Temp 98.2°F | Resp 13 | Ht 63.5 in | Wt 158.0 lb

## 2019-12-25 DIAGNOSIS — D122 Benign neoplasm of ascending colon: Secondary | ICD-10-CM

## 2019-12-25 DIAGNOSIS — D120 Benign neoplasm of cecum: Secondary | ICD-10-CM

## 2019-12-25 DIAGNOSIS — K589 Irritable bowel syndrome without diarrhea: Secondary | ICD-10-CM

## 2019-12-25 MED ORDER — SODIUM CHLORIDE 0.9 % IV SOLN: 500.0000 mL | Freq: Once | INTRAVENOUS | Status: AC

## 2019-12-25 NOTE — Progress Notes (Signed)
Called to room to assist during endoscopic procedure.  Patient ID and intended procedure confirmed with present staff. Received instructions for my participation in the procedure from the performing physician.  

## 2019-12-25 NOTE — Progress Notes (Signed)
[place]

## 2019-12-25 NOTE — Op Note (Signed)
[facility] Patient Name: Brooke [name] Procedure Date: 12/25/2019 9:37 AM MRN: [id number] Endoscopist: Jackquline Denmark , MD Age: 44 Referring MD:  Date of Birth: 28-Jul-1976 Gender: Female Account #: [id number] Procedure:                Colonoscopy Indications:              High risk colon cancer surveillance: Personal                            history of colonic polyps. IBD-C. H/O                            diverticulitis. Medicines:                Monitored Anesthesia Care Procedure:                Pre-Anesthesia Assessment:                           - Prior to the procedure, a History and Physical                            was performed, and patient medications and                            allergies were reviewed. The patient's tolerance of                            previous anesthesia was also reviewed. The risks                            and benefits of the procedure and the sedation                            options and risks were discussed with the patient.                            All questions were answered, and informed consent                            was obtained. Prior Anticoagulants: The patient has                            taken no previous anticoagulant or antiplatelet                            agents. ASA Grade Assessment: II - A patient with                            mild systemic disease. After reviewing the risks                            and benefits, the patient was deemed in  satisfactory condition to undergo the procedure.                           After obtaining informed consent, the colonoscope                            was passed under direct vision. Throughout the                            procedure, the patient's blood pressure, pulse, and                            oxygen saturations were monitored continuously. The                            Colonoscope was introduced through the anus and              advanced to the 2 cm into the ileum. The terminal                            ileum, ileocecal valve, appendiceal orifice, and                            rectum were photographed. Scope In: 9:50:14 AM Scope Out: 10:07:59 AM Scope Withdrawal Time: 0 hours 13 minutes 18 seconds  Total Procedure Duration: 0 hours 17 minutes 45 seconds  Findings:                 A 2 mm polyp was found in the cecum. The polyp was                            sessile. The polyp was removed with a cold biopsy                            forceps. Resection and retrieval were complete.                           A 8 mm polyp was found in the proximal ascending                            colon. The polyp was sessile. The polyp was removed                            with a cold snare. Resection and retrieval were                            complete.                           Multiple medium-mouthed diverticula were found in                            the sigmoid colon, descending colon, transverse  colon, few in ascending colon and cecum as well.                           Non-bleeding internal hemorrhoids were found during                            retroflexion. The hemorrhoids were small.                           The terminal ileum appeared normal. Complications:            No immediate complications. Estimated Blood Loss:     Estimated blood loss: none. Impression:               - Colonic polyps s/p polypectomy.                           - Pancolonic diverticulosis predominantly in the                            sigmoid colon.                           - Non-bleeding internal hemorrhoids.                           - Otherwise normal colonoscopy to TI. Recommendation:           - Patient has a contact number available for                            emergencies. The signs and symptoms of potential                            delayed complications were discussed with the                             patient. Return to normal activities tomorrow.                            Written discharge instructions were provided to the                            patient.                           - High fiber diet.                           - Continue present medications.                           - Use Benefiber one teaspoon PO daily with 8 oz                            water.                           -  If still with hard stools, start Colace 1 tablet                            p.o. once a day.                           - Await pathology results.                           - Repeat colonoscopy for surveillance based on                            pathology results.                           - The findings and recommendations were discussed                            with the patient's husband Jacqulynn Cadet, MD 12/25/2019 10:18:21 AM This report has been signed electronically.

## 2019-12-25 NOTE — Patient Instructions (Signed)
YOU HAD AN ENDOSCOPIC PROCEDURE TODAY AT [facility]:   Refer to the procedure report that was given to you for any specific questions about what was found during the examination.  If the procedure report does not answer your questions, please call your gastroenterologist to clarify.  If you requested that your care partner not be given the details of your procedure findings, then the procedure report has been included in a sealed envelope for you to review at your convenience later.  YOU SHOULD EXPECT: Some feelings of bloating in the abdomen. Passage of more gas than usual.  Walking can help get rid of the air that was put into your GI tract during the procedure and reduce the bloating. If you had a lower endoscopy (such as a colonoscopy or flexible sigmoidoscopy) you may notice spotting of blood in your stool or on the toilet paper. If you underwent a bowel prep for your procedure, you may not have a normal bowel movement for a few days.  Please Note:  You might notice some irritation and congestion in your nose or some drainage.  This is from the oxygen used during your procedure.  There is no need for concern and it should clear up in a day or so.  SYMPTOMS TO REPORT IMMEDIATELY:   Following lower endoscopy (colonoscopy or flexible sigmoidoscopy):  Excessive amounts of blood in the stool  Significant tenderness or worsening of abdominal pains  Swelling of the abdomen that is new, acute  Fever of 100F or higher    For urgent or emergent issues, a gastroenterologist can be reached at any hour by calling [phone]. Do not use MyChart messaging for urgent concerns.    DIET:  We do recommend a small meal at first, but then you may proceed to your regular diet.  Drink plenty of fluids but you should avoid alcoholic beverages for 24 hours.  ACTIVITY:  You should plan to take it easy for the rest of today and you should NOT DRIVE or use heavy machinery until tomorrow  (because of the sedation medicines used during the test).    FOLLOW UP: Our staff will call the number listed on your records 48-72 hours following your procedure to check on you and address any questions or concerns that you may have regarding the information given to you following your procedure. If we do not reach you, we will leave a message.  We will attempt to reach you two times.  During this call, we will ask if you have developed any symptoms of COVID 19. If you develop any symptoms (ie: fever, flu-like symptoms, shortness of breath, cough etc.) before then, please call [phone].  If you test positive for Covid 19 in the 2 weeks post procedure, please call and report this information to [place].    If any biopsies were taken you will be contacted by phone or by letter within the next 1-3 weeks.  Please call us at [phone] if you have not heard about the biopsies in 3 weeks.    SIGNATURES/CONFIDENTIALITY: You and/or your care partner have signed paperwork which will be entered into your electronic medical record.  These signatures attest to the fact that that the information above on your After Visit Summary has been reviewed and is understood.  Full responsibility of the confidentiality of this discharge information lies with you and/or your care-partner.   Resume medications. Information given on polyps,diverticulosis and high fiber diet. Per report Use BENEFIBER ONE  TEASPOON DAILY WITH 8 OZ WATER.IF STILL HAVING HARD STOOLS START COLACE 1 TABLET ONCE DAILY.

## 2019-12-25 NOTE — Progress Notes (Signed)
pt tolerated well. VSS. awake and to recovery. Report given to RN.  

## 2019-12-26 ENCOUNTER — Telehealth: Payer: Self-pay | Admitting: Gastroenterology

## 2019-12-26 NOTE — Telephone Encounter (Signed)
Spoke to patient who states that she woke up in the middle of the night with chills and sweating,she states her clothes were drenched . She had a dull headache as well. Patient did not take her temperature at the time of her symptoms,and went back to sleep. Upon wakening this morning at 6 am she felt better.Chills and sweating resolved, but still has a very mild headache. She took her temperature twice this morning, both times were afebrile. Explained to patient that her symptoms may have been a result of  the the [place] working its way out of the body. She was advised to keep hydrated,take Tylenol or motrin for her headache and rest today. She will call the office if she develops a fever. All questions answered. Patient voiced understanding.

## 2019-12-27 ENCOUNTER — Telehealth: Payer: Self-pay

## 2019-12-27 NOTE — Telephone Encounter (Signed)
Attempted to reach pt. With follow-up call following endoscopic procedure 12/25/2019.  LM on pt. Voice mail.  Will try to reach pt. Again later today.

## 2019-12-27 NOTE — Telephone Encounter (Signed)
Attempted to reach patient for post-procedure f/u call. No answer. Left message for her to please not hesitate to call us if she has any questions/concerns regarding her care. 

## 2019-12-28 ENCOUNTER — Encounter: Payer: Self-pay | Admitting: Gastroenterology

## 2021-04-19 ENCOUNTER — Other Ambulatory Visit: Payer: Self-pay | Admitting: Gastroenterology
